# Patient Record
Sex: Male | Born: 1935 | Race: White | Hispanic: No | Smoking: Never smoker
Health system: Southern US, Community
[De-identification: ages and names within clinical notes are randomized; demographics above are authoritative.]

## PROBLEM LIST (undated history)

## (undated) DIAGNOSIS — I639 Cerebral infarction, unspecified: Secondary | ICD-10-CM

## (undated) DIAGNOSIS — I10 Essential (primary) hypertension: Secondary | ICD-10-CM

## (undated) DIAGNOSIS — E7800 Pure hypercholesterolemia, unspecified: Secondary | ICD-10-CM

## (undated) DIAGNOSIS — M1990 Unspecified osteoarthritis, unspecified site: Secondary | ICD-10-CM

## (undated) DIAGNOSIS — T304 Corrosion of unspecified body region, unspecified degree: Secondary | ICD-10-CM

## (undated) DIAGNOSIS — I4891 Unspecified atrial fibrillation: Secondary | ICD-10-CM

## (undated) DIAGNOSIS — N289 Disorder of kidney and ureter, unspecified: Secondary | ICD-10-CM

## (undated) DIAGNOSIS — C4490 Unspecified malignant neoplasm of skin, unspecified: Secondary | ICD-10-CM

## (undated) HISTORY — PX: KNEE SURGERY: SHX244

## (undated) HISTORY — PX: SHOULDER SURGERY: SHX246

## (undated) HISTORY — PX: TONSILLECTOMY: SUR1361

---

## 2001-11-06 ENCOUNTER — Emergency Department (HOSPITAL_COMMUNITY): Admission: EM | Admit: 2001-11-06 | Discharge: 2001-11-06 | Payer: Self-pay | Admitting: Emergency Medicine

## 2003-06-26 ENCOUNTER — Ambulatory Visit (HOSPITAL_COMMUNITY): Admission: RE | Admit: 2003-06-26 | Discharge: 2003-06-26 | Payer: Self-pay | Admitting: Internal Medicine

## 2005-11-21 ENCOUNTER — Ambulatory Visit (HOSPITAL_COMMUNITY): Admission: RE | Admit: 2005-11-21 | Discharge: 2005-11-21 | Payer: Self-pay | Admitting: Internal Medicine

## 2008-01-21 ENCOUNTER — Ambulatory Visit: Payer: Self-pay | Admitting: Gastroenterology

## 2008-01-28 ENCOUNTER — Encounter: Payer: Self-pay | Admitting: Gastroenterology

## 2008-01-28 ENCOUNTER — Ambulatory Visit (HOSPITAL_COMMUNITY): Admission: RE | Admit: 2008-01-28 | Discharge: 2008-01-28 | Payer: Self-pay | Admitting: Gastroenterology

## 2008-01-28 ENCOUNTER — Ambulatory Visit: Payer: Self-pay | Admitting: Gastroenterology

## 2010-07-20 NOTE — Op Note (Signed)
NAME:  Joseph Stephenson, Joseph Stephenson NO.:  [id number]   MEDICAL RECORD NO.:  [id number]          PATIENT TYPE:  AMB   LOCATION:  DAY                           FACILITY:  [facility]   PHYSICIAN:  Kassie Mends, M.D.      DATE OF BIRTH:  12-Sep-1925   DATE OF PROCEDURE:  01/28/2008  DATE OF DISCHARGE:                               OPERATIVE REPORT   REFERRING PHYSICIAN:  Kingsley Callander. Ouida Sills, MD   PROCEDURE:  Colonoscopy with cold forceps and snare cautery polypectomy.   INDICATION FOR EXAM:  Joseph Stephenson is an 77 year old male who had  colonoscopy in 1997 with hyperplastic polyps.   FINDINGS:  1. A 4-mm sessile transverse colon polyp removed via cold forceps.  A      6-sessile rectal polyp removed via cold forceps.  A 6-sessile      transverse colon polyps removed via snare cautery and cold forceps.      The polyps vary from 3 mm to 6 mm.  2. Rare sigmoid colon and diverticula.  Otherwise, no masses,      inflammatory changes, or AVMs seen.  3. Normal retroflexed view of the rectum.   DIAGNOSES:  1. Mild diverticulosis.  2. Colon polyps.   RECOMMENDATIONS:  1. No aspirin, NSAIDs, or anticoagulation for 5 days.  2. Will Joseph Stephenson with results of his biopsies.  If he has simple      adenomas or hyperplastic polyps, then he may have a repeat      colonoscopy in 10-15 years if he remains healthy.  3. He should follow a high-fiber diet.  Given handout on high-fiber      diet and polyps.   MEDICATIONS:  1. Demerol 50 mg IV.  2. Versed 3 mg IV.   PROCEDURE TECHNIQUE:  Physical exam was performed.  Informed consent was  obtained from the patient explaining the benefits, risks, and  alternatives to the procedure.  The patient was connected to monitor and  placed in left lateral position.  Continuous oxygen was provided by  nasal cannula.  IV medicine administered through an indwelling cannula.  After administration of sedation and rectal exam, the patient's rectum  was intubated and the  scope was advanced under direct visualization to  the cecum. The scope was removed slowly by carefully examining  the color, texture, anatomy, and integrity of the mucosa on the way out.  The patient was recovered in endoscopy and discharged home in  satisfactory condition.   PATH:  Simple adenoma & hyperplastic polyps.      Kassie Mends, M.D.  Electronically Signed     SM/MEDQ  D:  01/28/2008  T:  01/29/2008  Job:  [id number]   cc:   Kingsley Callander. Ouida Sills, MD  Fax: [phone]

## 2010-07-20 NOTE — Consult Note (Signed)
NAME:  Joseph Stephenson, Joseph Stephenson NO.:  [id number]   MEDICAL RECORD NO.:  [id number]          PATIENT TYPE:  AMB   LOCATION:  DAY                           FACILITY:  [facility]   PHYSICIAN:  Joseph Stephenson, M.D.      DATE OF BIRTH:  10-21-1925   DATE OF CONSULTATION:  01/21/2008  DATE OF DISCHARGE:                                 CONSULTATION   REASON FOR CONSULTATION:  History of colon polyps, time for colonoscopy.   PHYSICIAN REQUESTING CONSULTATION:  Joseph Stephenson. Joseph Sills, MD   HISTORY OF PRESENT ILLNESS:  The patient is an 77 year old Caucasian  gentleman, patient of Dr. Ouida Stephenson, who presents today to schedule  surveillance colonoscopy.  He has a history of polyps.  He had a  colonoscopy in 1997 and had multiple tiny polyps removed, but these were  hyperplastic.  He denies any ongoing problems.  His bowel movements were  regular.  He denies any blood in the stool or black stools.  He denies  any abdominal pain, nausea, vomiting, heartburn, dysphagia, or  odynophagia.  He has had about 28-pound intentional weight loss.   CURRENT MEDICATIONS:  1. Aspirin 81 mg daily.  2. Labetalol 100 mg b.i.d.  3. Amlodipine 5 mg daily.  4. Lipitor 10 mg daily.   ALLERGIES:  No known drug allergies.   PAST MEDICAL HISTORY:  1. Hypertension.  2. Hypercholesterolemia.  3. He had knee surgery for repair of cartilage.  4. He had shoulder surgery while in college.  5. Cataract extraction.  6. History of stroke in 1996.   FAMILY HISTORY:  Negative for colorectal cancer.  His mother died at age  80, childbirth injury.  Father died in his late 68s.   SOCIAL HISTORY:  He is married.  He has 2 grandchildren.  He is working  at [employer], where he has been working for 25 years.  He denies any tobacco use ever.  No alcohol use.   REVIEW OF SYSTEMS:  GI:  See HPI.  CONSTITUTIONAL:  See HPI.  CARDIOPULMONARY:  No chest pain, shortness of breath, palpitations, or  cough.  GENITOURINARY:   No dysuria or hematuria.   PHYSICAL EXAMINATION:  VITAL SIGNS:  Weight 200, height 5 feet 10  inches, temp 98, blood pressure 140/80, and pulse 60.  GENERAL:  Pleasant, mildly obese Caucasian gentleman in no acute  distress.  SKIN:  Warm and dry.  No jaundice.  HEENT:  Sclerae nonicteric.  Oropharyngeal mucosa moist and pink.  No  lesions, erythema, or exudate.  No lymphadenopathy or thyromegaly.  CHEST:  Lungs were clear to auscultation.  CARDIAC:  Regular rate and rhythm.  Normal S1 and S2.  No murmurs, rubs,  or gallops.  ABDOMEN:  Positive bowel sounds.  Abdomen soft, nontender, and  nondistended.  No organomegaly or masses.  No rebound or guarding.  No  abdominal bruits or hernias.  LOWER EXTREMITIES:  No edema.   IMPRESSION:  Joseph Stephenson is an 49 year old gentleman with history of  hyperplastic polyps on colonoscopy back in 1997.  He had prior  colonoscopy in 1992  with polyps with histology unavailable.   PLAN:  1. Colonoscopy in the near future with Dr. Cira Stephenson.  2. We will discuss with Dr. Cira Stephenson regarding his aspirin use prior to      colonoscopy.      Joseph Stephenson, P.A.      Joseph Stephenson, M.D.  Electronically Signed    LL/MEDQ  D:  01/21/2008  T:  01/22/2008  Job:  [id number]   cc:   Joseph Stephenson. Joseph Sills, MD  Fax: [phone]

## 2014-07-24 ENCOUNTER — Telehealth: Payer: Self-pay | Admitting: Gastroenterology

## 2014-07-24 NOTE — Telephone Encounter (Signed)
REFERRED BY DR. Willey Blade. NEEDS OPV ASAP FOR Bright red blood in stool yesterday am.

## 2014-07-24 NOTE — Telephone Encounter (Signed)
MADE APPOINTMENT FOR 07/25/14 AT 9AM AND LEFT MESSAGE ON PATIENT HOME PHONE

## 2014-07-25 ENCOUNTER — Ambulatory Visit: Payer: Self-pay | Admitting: Gastroenterology

## 2014-07-25 NOTE — Telephone Encounter (Signed)
Patient called and stated he didn't think he needed this appointment today and he will call Dr. Ria Comment office to make sure and give our office a call back.

## 2015-07-14 DIAGNOSIS — N189 Chronic kidney disease, unspecified: Secondary | ICD-10-CM | POA: Diagnosis not present

## 2015-07-14 DIAGNOSIS — I6350 Cerebral infarction due to unspecified occlusion or stenosis of unspecified cerebral artery: Secondary | ICD-10-CM | POA: Diagnosis not present

## 2015-07-14 DIAGNOSIS — Z79899 Other long term (current) drug therapy: Secondary | ICD-10-CM | POA: Diagnosis not present

## 2015-07-21 DIAGNOSIS — I10 Essential (primary) hypertension: Secondary | ICD-10-CM | POA: Diagnosis not present

## 2015-07-21 DIAGNOSIS — N183 Chronic kidney disease, stage 3 (moderate): Secondary | ICD-10-CM | POA: Diagnosis not present

## 2015-07-21 DIAGNOSIS — E875 Hyperkalemia: Secondary | ICD-10-CM | POA: Diagnosis not present

## 2015-08-14 DIAGNOSIS — H401131 Primary open-angle glaucoma, bilateral, mild stage: Secondary | ICD-10-CM | POA: Diagnosis not present

## 2015-10-13 DIAGNOSIS — N189 Chronic kidney disease, unspecified: Secondary | ICD-10-CM | POA: Diagnosis not present

## 2015-10-13 DIAGNOSIS — E785 Hyperlipidemia, unspecified: Secondary | ICD-10-CM | POA: Diagnosis not present

## 2015-10-13 DIAGNOSIS — I10 Essential (primary) hypertension: Secondary | ICD-10-CM | POA: Diagnosis not present

## 2015-10-13 DIAGNOSIS — Z79899 Other long term (current) drug therapy: Secondary | ICD-10-CM | POA: Diagnosis not present

## 2015-10-20 DIAGNOSIS — I10 Essential (primary) hypertension: Secondary | ICD-10-CM | POA: Diagnosis not present

## 2015-10-20 DIAGNOSIS — E875 Hyperkalemia: Secondary | ICD-10-CM | POA: Diagnosis not present

## 2015-10-20 DIAGNOSIS — N183 Chronic kidney disease, stage 3 (moderate): Secondary | ICD-10-CM | POA: Diagnosis not present

## 2016-01-08 DIAGNOSIS — H32 Chorioretinal disorders in diseases classified elsewhere: Secondary | ICD-10-CM | POA: Diagnosis not present

## 2016-01-08 DIAGNOSIS — B399 Histoplasmosis, unspecified: Secondary | ICD-10-CM | POA: Diagnosis not present

## 2016-01-18 DIAGNOSIS — N189 Chronic kidney disease, unspecified: Secondary | ICD-10-CM | POA: Diagnosis not present

## 2016-01-18 DIAGNOSIS — I6350 Cerebral infarction due to unspecified occlusion or stenosis of unspecified cerebral artery: Secondary | ICD-10-CM | POA: Diagnosis not present

## 2016-01-18 DIAGNOSIS — E875 Hyperkalemia: Secondary | ICD-10-CM | POA: Diagnosis not present

## 2016-01-18 DIAGNOSIS — E785 Hyperlipidemia, unspecified: Secondary | ICD-10-CM | POA: Diagnosis not present

## 2016-01-18 DIAGNOSIS — I10 Essential (primary) hypertension: Secondary | ICD-10-CM | POA: Diagnosis not present

## 2016-01-18 DIAGNOSIS — Z79899 Other long term (current) drug therapy: Secondary | ICD-10-CM | POA: Diagnosis not present

## 2016-01-25 DIAGNOSIS — I10 Essential (primary) hypertension: Secondary | ICD-10-CM | POA: Diagnosis not present

## 2016-01-25 DIAGNOSIS — Z23 Encounter for immunization: Secondary | ICD-10-CM | POA: Diagnosis not present

## 2016-01-25 DIAGNOSIS — E785 Hyperlipidemia, unspecified: Secondary | ICD-10-CM | POA: Diagnosis not present

## 2016-01-25 DIAGNOSIS — N183 Chronic kidney disease, stage 3 (moderate): Secondary | ICD-10-CM | POA: Diagnosis not present

## 2016-01-25 DIAGNOSIS — E875 Hyperkalemia: Secondary | ICD-10-CM | POA: Diagnosis not present

## 2016-02-17 DIAGNOSIS — H401131 Primary open-angle glaucoma, bilateral, mild stage: Secondary | ICD-10-CM | POA: Diagnosis not present

## 2016-04-19 DIAGNOSIS — E785 Hyperlipidemia, unspecified: Secondary | ICD-10-CM | POA: Diagnosis not present

## 2016-04-19 DIAGNOSIS — E875 Hyperkalemia: Secondary | ICD-10-CM | POA: Diagnosis not present

## 2016-04-19 DIAGNOSIS — Z79899 Other long term (current) drug therapy: Secondary | ICD-10-CM | POA: Diagnosis not present

## 2016-04-19 DIAGNOSIS — I10 Essential (primary) hypertension: Secondary | ICD-10-CM | POA: Diagnosis not present

## 2016-04-26 DIAGNOSIS — N183 Chronic kidney disease, stage 3 (moderate): Secondary | ICD-10-CM | POA: Diagnosis not present

## 2016-04-26 DIAGNOSIS — I10 Essential (primary) hypertension: Secondary | ICD-10-CM | POA: Diagnosis not present

## 2016-04-26 DIAGNOSIS — Z6833 Body mass index (BMI) 33.0-33.9, adult: Secondary | ICD-10-CM | POA: Diagnosis not present

## 2016-04-26 DIAGNOSIS — E875 Hyperkalemia: Secondary | ICD-10-CM | POA: Diagnosis not present

## 2016-06-13 ENCOUNTER — Ambulatory Visit (INDEPENDENT_AMBULATORY_CARE_PROVIDER_SITE_OTHER): Payer: Medicare Other | Admitting: Otolaryngology

## 2016-06-13 DIAGNOSIS — H6123 Impacted cerumen, bilateral: Secondary | ICD-10-CM

## 2016-06-13 DIAGNOSIS — H900 Conductive hearing loss, bilateral: Secondary | ICD-10-CM

## 2016-07-19 DIAGNOSIS — N183 Chronic kidney disease, stage 3 (moderate): Secondary | ICD-10-CM | POA: Diagnosis not present

## 2016-07-19 DIAGNOSIS — E875 Hyperkalemia: Secondary | ICD-10-CM | POA: Diagnosis not present

## 2016-07-19 DIAGNOSIS — I10 Essential (primary) hypertension: Secondary | ICD-10-CM | POA: Diagnosis not present

## 2016-07-19 DIAGNOSIS — Z79899 Other long term (current) drug therapy: Secondary | ICD-10-CM | POA: Diagnosis not present

## 2016-07-26 DIAGNOSIS — Z23 Encounter for immunization: Secondary | ICD-10-CM | POA: Diagnosis not present

## 2016-07-26 DIAGNOSIS — I10 Essential (primary) hypertension: Secondary | ICD-10-CM | POA: Diagnosis not present

## 2016-07-26 DIAGNOSIS — E875 Hyperkalemia: Secondary | ICD-10-CM | POA: Diagnosis not present

## 2016-07-26 DIAGNOSIS — N183 Chronic kidney disease, stage 3 (moderate): Secondary | ICD-10-CM | POA: Diagnosis not present

## 2016-08-18 DIAGNOSIS — Z961 Presence of intraocular lens: Secondary | ICD-10-CM | POA: Diagnosis not present

## 2016-08-18 DIAGNOSIS — H401131 Primary open-angle glaucoma, bilateral, mild stage: Secondary | ICD-10-CM | POA: Diagnosis not present

## 2016-08-18 DIAGNOSIS — Z9841 Cataract extraction status, right eye: Secondary | ICD-10-CM | POA: Diagnosis not present

## 2016-08-18 DIAGNOSIS — H43391 Other vitreous opacities, right eye: Secondary | ICD-10-CM | POA: Diagnosis not present

## 2016-10-10 ENCOUNTER — Ambulatory Visit (INDEPENDENT_AMBULATORY_CARE_PROVIDER_SITE_OTHER): Payer: Medicare Other | Admitting: Otolaryngology

## 2016-10-10 DIAGNOSIS — H6123 Impacted cerumen, bilateral: Secondary | ICD-10-CM

## 2016-10-27 DIAGNOSIS — E785 Hyperlipidemia, unspecified: Secondary | ICD-10-CM | POA: Diagnosis not present

## 2016-10-27 DIAGNOSIS — Z79899 Other long term (current) drug therapy: Secondary | ICD-10-CM | POA: Diagnosis not present

## 2016-10-27 DIAGNOSIS — E875 Hyperkalemia: Secondary | ICD-10-CM | POA: Diagnosis not present

## 2016-11-08 DIAGNOSIS — N183 Chronic kidney disease, stage 3 (moderate): Secondary | ICD-10-CM | POA: Diagnosis not present

## 2016-11-08 DIAGNOSIS — I10 Essential (primary) hypertension: Secondary | ICD-10-CM | POA: Diagnosis not present

## 2016-11-08 DIAGNOSIS — E875 Hyperkalemia: Secondary | ICD-10-CM | POA: Diagnosis not present

## 2016-12-23 DIAGNOSIS — B399 Histoplasmosis, unspecified: Secondary | ICD-10-CM | POA: Diagnosis not present

## 2016-12-23 DIAGNOSIS — H32 Chorioretinal disorders in diseases classified elsewhere: Secondary | ICD-10-CM | POA: Diagnosis not present

## 2017-01-30 DIAGNOSIS — E875 Hyperkalemia: Secondary | ICD-10-CM | POA: Diagnosis not present

## 2017-01-30 DIAGNOSIS — G464 Cerebellar stroke syndrome: Secondary | ICD-10-CM | POA: Diagnosis not present

## 2017-01-30 DIAGNOSIS — N183 Chronic kidney disease, stage 3 (moderate): Secondary | ICD-10-CM | POA: Diagnosis not present

## 2017-01-30 DIAGNOSIS — Z79899 Other long term (current) drug therapy: Secondary | ICD-10-CM | POA: Diagnosis not present

## 2017-02-06 DIAGNOSIS — E875 Hyperkalemia: Secondary | ICD-10-CM | POA: Diagnosis not present

## 2017-02-06 DIAGNOSIS — N183 Chronic kidney disease, stage 3 (moderate): Secondary | ICD-10-CM | POA: Diagnosis not present

## 2017-02-06 DIAGNOSIS — I10 Essential (primary) hypertension: Secondary | ICD-10-CM | POA: Diagnosis not present

## 2017-02-06 DIAGNOSIS — E785 Hyperlipidemia, unspecified: Secondary | ICD-10-CM | POA: Diagnosis not present

## 2017-03-03 DIAGNOSIS — H401131 Primary open-angle glaucoma, bilateral, mild stage: Secondary | ICD-10-CM | POA: Diagnosis not present

## 2017-05-15 DIAGNOSIS — I10 Essential (primary) hypertension: Secondary | ICD-10-CM | POA: Diagnosis not present

## 2017-05-15 DIAGNOSIS — E785 Hyperlipidemia, unspecified: Secondary | ICD-10-CM | POA: Diagnosis not present

## 2017-05-15 DIAGNOSIS — Z79899 Other long term (current) drug therapy: Secondary | ICD-10-CM | POA: Diagnosis not present

## 2017-05-15 DIAGNOSIS — E875 Hyperkalemia: Secondary | ICD-10-CM | POA: Diagnosis not present

## 2017-05-22 DIAGNOSIS — N183 Chronic kidney disease, stage 3 (moderate): Secondary | ICD-10-CM | POA: Diagnosis not present

## 2017-05-22 DIAGNOSIS — C4491 Basal cell carcinoma of skin, unspecified: Secondary | ICD-10-CM | POA: Diagnosis not present

## 2017-05-22 DIAGNOSIS — Z6833 Body mass index (BMI) 33.0-33.9, adult: Secondary | ICD-10-CM | POA: Diagnosis not present

## 2017-05-22 DIAGNOSIS — E875 Hyperkalemia: Secondary | ICD-10-CM | POA: Diagnosis not present

## 2017-05-31 DIAGNOSIS — C44319 Basal cell carcinoma of skin of other parts of face: Secondary | ICD-10-CM | POA: Diagnosis not present

## 2017-07-10 DIAGNOSIS — I10 Essential (primary) hypertension: Secondary | ICD-10-CM | POA: Diagnosis not present

## 2017-07-10 DIAGNOSIS — E875 Hyperkalemia: Secondary | ICD-10-CM | POA: Diagnosis not present

## 2017-07-10 DIAGNOSIS — Z79899 Other long term (current) drug therapy: Secondary | ICD-10-CM | POA: Diagnosis not present

## 2017-07-10 DIAGNOSIS — N183 Chronic kidney disease, stage 3 (moderate): Secondary | ICD-10-CM | POA: Diagnosis not present

## 2017-07-12 DIAGNOSIS — C44319 Basal cell carcinoma of skin of other parts of face: Secondary | ICD-10-CM | POA: Diagnosis not present

## 2017-07-17 DIAGNOSIS — Z6833 Body mass index (BMI) 33.0-33.9, adult: Secondary | ICD-10-CM | POA: Diagnosis not present

## 2017-07-17 DIAGNOSIS — I10 Essential (primary) hypertension: Secondary | ICD-10-CM | POA: Diagnosis not present

## 2017-07-17 DIAGNOSIS — Z8673 Personal history of transient ischemic attack (TIA), and cerebral infarction without residual deficits: Secondary | ICD-10-CM | POA: Diagnosis not present

## 2017-07-17 DIAGNOSIS — E875 Hyperkalemia: Secondary | ICD-10-CM | POA: Diagnosis not present

## 2017-08-07 DIAGNOSIS — Z961 Presence of intraocular lens: Secondary | ICD-10-CM | POA: Diagnosis not present

## 2017-08-07 DIAGNOSIS — H26492 Other secondary cataract, left eye: Secondary | ICD-10-CM | POA: Diagnosis not present

## 2017-08-07 DIAGNOSIS — H353134 Nonexudative age-related macular degeneration, bilateral, advanced atrophic with subfoveal involvement: Secondary | ICD-10-CM | POA: Diagnosis not present

## 2017-08-23 DIAGNOSIS — C44319 Basal cell carcinoma of skin of other parts of face: Secondary | ICD-10-CM | POA: Diagnosis not present

## 2017-08-31 DIAGNOSIS — C44319 Basal cell carcinoma of skin of other parts of face: Secondary | ICD-10-CM | POA: Diagnosis not present

## 2017-09-01 DIAGNOSIS — H401131 Primary open-angle glaucoma, bilateral, mild stage: Secondary | ICD-10-CM | POA: Diagnosis not present

## 2017-09-01 DIAGNOSIS — H04123 Dry eye syndrome of bilateral lacrimal glands: Secondary | ICD-10-CM | POA: Diagnosis not present

## 2017-09-01 DIAGNOSIS — Z9849 Cataract extraction status, unspecified eye: Secondary | ICD-10-CM | POA: Diagnosis not present

## 2017-09-01 DIAGNOSIS — Z961 Presence of intraocular lens: Secondary | ICD-10-CM | POA: Diagnosis not present

## 2017-10-10 DIAGNOSIS — L570 Actinic keratosis: Secondary | ICD-10-CM | POA: Diagnosis not present

## 2017-10-10 DIAGNOSIS — X32XXXD Exposure to sunlight, subsequent encounter: Secondary | ICD-10-CM | POA: Diagnosis not present

## 2017-10-10 DIAGNOSIS — Z08 Encounter for follow-up examination after completed treatment for malignant neoplasm: Secondary | ICD-10-CM | POA: Diagnosis not present

## 2017-10-10 DIAGNOSIS — Z85828 Personal history of other malignant neoplasm of skin: Secondary | ICD-10-CM | POA: Diagnosis not present

## 2017-10-16 DIAGNOSIS — I10 Essential (primary) hypertension: Secondary | ICD-10-CM | POA: Diagnosis not present

## 2017-10-16 DIAGNOSIS — Z79899 Other long term (current) drug therapy: Secondary | ICD-10-CM | POA: Diagnosis not present

## 2017-10-16 DIAGNOSIS — I6350 Cerebral infarction due to unspecified occlusion or stenosis of unspecified cerebral artery: Secondary | ICD-10-CM | POA: Diagnosis not present

## 2017-10-16 DIAGNOSIS — E785 Hyperlipidemia, unspecified: Secondary | ICD-10-CM | POA: Diagnosis not present

## 2017-10-23 DIAGNOSIS — I10 Essential (primary) hypertension: Secondary | ICD-10-CM | POA: Diagnosis not present

## 2017-10-23 DIAGNOSIS — E875 Hyperkalemia: Secondary | ICD-10-CM | POA: Diagnosis not present

## 2017-10-23 DIAGNOSIS — N183 Chronic kidney disease, stage 3 (moderate): Secondary | ICD-10-CM | POA: Diagnosis not present

## 2017-12-14 ENCOUNTER — Encounter: Payer: Self-pay | Admitting: Gastroenterology

## 2017-12-28 DIAGNOSIS — H353134 Nonexudative age-related macular degeneration, bilateral, advanced atrophic with subfoveal involvement: Secondary | ICD-10-CM | POA: Diagnosis not present

## 2018-01-16 DIAGNOSIS — G464 Cerebellar stroke syndrome: Secondary | ICD-10-CM | POA: Diagnosis not present

## 2018-01-16 DIAGNOSIS — N183 Chronic kidney disease, stage 3 (moderate): Secondary | ICD-10-CM | POA: Diagnosis not present

## 2018-01-16 DIAGNOSIS — E875 Hyperkalemia: Secondary | ICD-10-CM | POA: Diagnosis not present

## 2018-01-16 DIAGNOSIS — I10 Essential (primary) hypertension: Secondary | ICD-10-CM | POA: Diagnosis not present

## 2018-01-22 DIAGNOSIS — N183 Chronic kidney disease, stage 3 (moderate): Secondary | ICD-10-CM | POA: Diagnosis not present

## 2018-01-22 DIAGNOSIS — Z23 Encounter for immunization: Secondary | ICD-10-CM | POA: Diagnosis not present

## 2018-01-22 DIAGNOSIS — E875 Hyperkalemia: Secondary | ICD-10-CM | POA: Diagnosis not present

## 2018-02-16 DIAGNOSIS — N183 Chronic kidney disease, stage 3 (moderate): Secondary | ICD-10-CM | POA: Diagnosis not present

## 2018-02-16 DIAGNOSIS — E875 Hyperkalemia: Secondary | ICD-10-CM | POA: Diagnosis not present

## 2018-02-22 DIAGNOSIS — N183 Chronic kidney disease, stage 3 (moderate): Secondary | ICD-10-CM | POA: Diagnosis not present

## 2018-02-22 DIAGNOSIS — E875 Hyperkalemia: Secondary | ICD-10-CM | POA: Diagnosis not present

## 2018-03-16 DIAGNOSIS — Z9849 Cataract extraction status, unspecified eye: Secondary | ICD-10-CM | POA: Diagnosis not present

## 2018-03-16 DIAGNOSIS — H43393 Other vitreous opacities, bilateral: Secondary | ICD-10-CM | POA: Diagnosis not present

## 2018-03-16 DIAGNOSIS — Z961 Presence of intraocular lens: Secondary | ICD-10-CM | POA: Diagnosis not present

## 2018-03-16 DIAGNOSIS — H04123 Dry eye syndrome of bilateral lacrimal glands: Secondary | ICD-10-CM | POA: Diagnosis not present

## 2018-04-26 DIAGNOSIS — N183 Chronic kidney disease, stage 3 (moderate): Secondary | ICD-10-CM | POA: Diagnosis not present

## 2018-04-26 DIAGNOSIS — E875 Hyperkalemia: Secondary | ICD-10-CM | POA: Diagnosis not present

## 2018-05-04 ENCOUNTER — Ambulatory Visit (HOSPITAL_COMMUNITY)
Admission: RE | Admit: 2018-05-04 | Discharge: 2018-05-04 | Disposition: A | Discharge status: Home / Self Care | Payer: Medicare Other | Source: Ambulatory Visit | Attending: Internal Medicine | Admitting: Internal Medicine

## 2018-05-04 ENCOUNTER — Other Ambulatory Visit (HOSPITAL_COMMUNITY): Payer: Self-pay | Admitting: Internal Medicine

## 2018-05-04 DIAGNOSIS — M25552 Pain in left hip: Secondary | ICD-10-CM

## 2018-05-04 DIAGNOSIS — I129 Hypertensive chronic kidney disease with stage 1 through stage 4 chronic kidney disease, or unspecified chronic kidney disease: Secondary | ICD-10-CM | POA: Diagnosis not present

## 2018-05-04 DIAGNOSIS — E875 Hyperkalemia: Secondary | ICD-10-CM | POA: Diagnosis not present

## 2018-05-04 DIAGNOSIS — N183 Chronic kidney disease, stage 3 (moderate): Secondary | ICD-10-CM | POA: Diagnosis not present

## 2018-05-04 DIAGNOSIS — M1612 Unilateral primary osteoarthritis, left hip: Secondary | ICD-10-CM | POA: Diagnosis not present

## 2018-05-04 DIAGNOSIS — Z6832 Body mass index (BMI) 32.0-32.9, adult: Secondary | ICD-10-CM | POA: Diagnosis not present

## 2018-07-26 DIAGNOSIS — E875 Hyperkalemia: Secondary | ICD-10-CM | POA: Diagnosis not present

## 2018-07-26 DIAGNOSIS — N183 Chronic kidney disease, stage 3 (moderate): Secondary | ICD-10-CM | POA: Diagnosis not present

## 2018-07-26 DIAGNOSIS — Z79899 Other long term (current) drug therapy: Secondary | ICD-10-CM | POA: Diagnosis not present

## 2018-07-26 DIAGNOSIS — I10 Essential (primary) hypertension: Secondary | ICD-10-CM | POA: Diagnosis not present

## 2018-08-02 DIAGNOSIS — N183 Chronic kidney disease, stage 3 (moderate): Secondary | ICD-10-CM | POA: Diagnosis not present

## 2018-08-02 DIAGNOSIS — E875 Hyperkalemia: Secondary | ICD-10-CM | POA: Diagnosis not present

## 2018-11-05 DIAGNOSIS — E875 Hyperkalemia: Secondary | ICD-10-CM | POA: Diagnosis not present

## 2018-11-05 DIAGNOSIS — N183 Chronic kidney disease, stage 3 (moderate): Secondary | ICD-10-CM | POA: Diagnosis not present

## 2018-11-09 DIAGNOSIS — E875 Hyperkalemia: Secondary | ICD-10-CM | POA: Diagnosis not present

## 2018-11-09 DIAGNOSIS — N183 Chronic kidney disease, stage 3 (moderate): Secondary | ICD-10-CM | POA: Diagnosis not present

## 2018-11-09 DIAGNOSIS — Z79899 Other long term (current) drug therapy: Secondary | ICD-10-CM | POA: Diagnosis not present

## 2019-02-07 DIAGNOSIS — N183 Chronic kidney disease, stage 3 unspecified: Secondary | ICD-10-CM | POA: Diagnosis not present

## 2019-02-07 DIAGNOSIS — E875 Hyperkalemia: Secondary | ICD-10-CM | POA: Diagnosis not present

## 2019-02-07 DIAGNOSIS — Z79899 Other long term (current) drug therapy: Secondary | ICD-10-CM | POA: Diagnosis not present

## 2019-02-07 DIAGNOSIS — Z23 Encounter for immunization: Secondary | ICD-10-CM | POA: Diagnosis not present

## 2019-02-07 DIAGNOSIS — I10 Essential (primary) hypertension: Secondary | ICD-10-CM | POA: Diagnosis not present

## 2019-02-07 DIAGNOSIS — N1832 Chronic kidney disease, stage 3b: Secondary | ICD-10-CM | POA: Diagnosis not present

## 2019-02-07 DIAGNOSIS — Z8673 Personal history of transient ischemic attack (TIA), and cerebral infarction without residual deficits: Secondary | ICD-10-CM | POA: Diagnosis not present

## 2019-05-17 DIAGNOSIS — E875 Hyperkalemia: Secondary | ICD-10-CM | POA: Diagnosis not present

## 2019-05-17 DIAGNOSIS — N183 Chronic kidney disease, stage 3 unspecified: Secondary | ICD-10-CM | POA: Diagnosis not present

## 2019-05-23 DIAGNOSIS — N1831 Chronic kidney disease, stage 3a: Secondary | ICD-10-CM | POA: Diagnosis not present

## 2019-05-23 DIAGNOSIS — E875 Hyperkalemia: Secondary | ICD-10-CM | POA: Diagnosis not present

## 2019-07-11 DIAGNOSIS — Z6832 Body mass index (BMI) 32.0-32.9, adult: Secondary | ICD-10-CM | POA: Diagnosis not present

## 2019-07-11 DIAGNOSIS — M25462 Effusion, left knee: Secondary | ICD-10-CM | POA: Diagnosis not present

## 2019-08-27 DIAGNOSIS — Z79899 Other long term (current) drug therapy: Secondary | ICD-10-CM | POA: Diagnosis not present

## 2019-08-27 DIAGNOSIS — E875 Hyperkalemia: Secondary | ICD-10-CM | POA: Diagnosis not present

## 2019-08-27 DIAGNOSIS — N183 Chronic kidney disease, stage 3 unspecified: Secondary | ICD-10-CM | POA: Diagnosis not present

## 2019-09-03 DIAGNOSIS — E875 Hyperkalemia: Secondary | ICD-10-CM | POA: Diagnosis not present

## 2019-09-03 DIAGNOSIS — Z6830 Body mass index (BMI) 30.0-30.9, adult: Secondary | ICD-10-CM | POA: Diagnosis not present

## 2019-09-03 DIAGNOSIS — I10 Essential (primary) hypertension: Secondary | ICD-10-CM | POA: Diagnosis not present

## 2019-09-03 DIAGNOSIS — N183 Chronic kidney disease, stage 3 unspecified: Secondary | ICD-10-CM | POA: Diagnosis not present

## 2019-12-13 DIAGNOSIS — G464 Cerebellar stroke syndrome: Secondary | ICD-10-CM | POA: Diagnosis not present

## 2019-12-13 DIAGNOSIS — N183 Chronic kidney disease, stage 3 unspecified: Secondary | ICD-10-CM | POA: Diagnosis not present

## 2019-12-13 DIAGNOSIS — I10 Essential (primary) hypertension: Secondary | ICD-10-CM | POA: Diagnosis not present

## 2019-12-13 DIAGNOSIS — E875 Hyperkalemia: Secondary | ICD-10-CM | POA: Diagnosis not present

## 2019-12-26 DIAGNOSIS — Z23 Encounter for immunization: Secondary | ICD-10-CM | POA: Diagnosis not present

## 2019-12-26 DIAGNOSIS — I10 Essential (primary) hypertension: Secondary | ICD-10-CM | POA: Diagnosis not present

## 2019-12-26 DIAGNOSIS — E875 Hyperkalemia: Secondary | ICD-10-CM | POA: Diagnosis not present

## 2019-12-26 DIAGNOSIS — N183 Chronic kidney disease, stage 3 unspecified: Secondary | ICD-10-CM | POA: Diagnosis not present

## 2020-03-20 DIAGNOSIS — E875 Hyperkalemia: Secondary | ICD-10-CM | POA: Diagnosis not present

## 2020-03-20 DIAGNOSIS — N183 Chronic kidney disease, stage 3 unspecified: Secondary | ICD-10-CM | POA: Diagnosis not present

## 2020-03-20 DIAGNOSIS — Z79899 Other long term (current) drug therapy: Secondary | ICD-10-CM | POA: Diagnosis not present

## 2020-03-20 DIAGNOSIS — I10 Essential (primary) hypertension: Secondary | ICD-10-CM | POA: Diagnosis not present

## 2020-03-27 ENCOUNTER — Telehealth: Payer: Self-pay

## 2020-03-27 DIAGNOSIS — Z01812 Encounter for preprocedural laboratory examination: Secondary | ICD-10-CM | POA: Diagnosis not present

## 2020-03-27 DIAGNOSIS — I482 Chronic atrial fibrillation, unspecified: Secondary | ICD-10-CM | POA: Diagnosis not present

## 2020-03-27 DIAGNOSIS — N1831 Chronic kidney disease, stage 3a: Secondary | ICD-10-CM | POA: Diagnosis not present

## 2020-03-27 DIAGNOSIS — Z79899 Other long term (current) drug therapy: Secondary | ICD-10-CM | POA: Diagnosis not present

## 2020-03-27 DIAGNOSIS — I4891 Unspecified atrial fibrillation: Secondary | ICD-10-CM | POA: Diagnosis not present

## 2020-03-27 DIAGNOSIS — E876 Hypokalemia: Secondary | ICD-10-CM | POA: Diagnosis not present

## 2020-03-27 NOTE — Telephone Encounter (Signed)
Tried to reach pt to schedule echo , prefers Thursday or Friday ,    Dr Willey Blade office ordered for AFIB and h/o stroke  Order faxed to scheduling department

## 2020-03-30 ENCOUNTER — Other Ambulatory Visit (HOSPITAL_COMMUNITY): Payer: Self-pay | Admitting: Internal Medicine

## 2020-03-30 DIAGNOSIS — Z8673 Personal history of transient ischemic attack (TIA), and cerebral infarction without residual deficits: Secondary | ICD-10-CM

## 2020-03-30 DIAGNOSIS — I4891 Unspecified atrial fibrillation: Secondary | ICD-10-CM

## 2020-03-30 NOTE — Telephone Encounter (Signed)
Tried to reach patient to schedule echo

## 2020-04-03 DIAGNOSIS — Z23 Encounter for immunization: Secondary | ICD-10-CM | POA: Diagnosis not present

## 2020-04-10 ENCOUNTER — Other Ambulatory Visit (HOSPITAL_COMMUNITY): Payer: Medicare Other

## 2020-04-14 DIAGNOSIS — E02 Subclinical iodine-deficiency hypothyroidism: Secondary | ICD-10-CM | POA: Diagnosis not present

## 2020-04-14 DIAGNOSIS — I482 Chronic atrial fibrillation, unspecified: Secondary | ICD-10-CM | POA: Diagnosis not present

## 2020-04-16 ENCOUNTER — Ambulatory Visit (HOSPITAL_COMMUNITY)
Admission: RE | Admit: 2020-04-16 | Discharge: 2020-04-16 | Disposition: A | Discharge status: Home / Self Care | Payer: Medicare Other | Source: Ambulatory Visit | Attending: Internal Medicine | Admitting: Internal Medicine

## 2020-04-16 ENCOUNTER — Other Ambulatory Visit: Payer: Self-pay

## 2020-04-16 DIAGNOSIS — I4891 Unspecified atrial fibrillation: Secondary | ICD-10-CM | POA: Diagnosis not present

## 2020-04-16 DIAGNOSIS — Z8673 Personal history of transient ischemic attack (TIA), and cerebral infarction without residual deficits: Secondary | ICD-10-CM

## 2020-04-16 LAB — ECHOCARDIOGRAM COMPLETE
AR max vel: 2.57 cm2
AV Area VTI: 2.63 cm2
AV Area mean vel: 2.54 cm2
AV Mean grad: 5.9 mmHg
AV Peak grad: 11.2 mmHg
Ao pk vel: 1.67 m/s
Area-P 1/2: 4.74 cm2
P 1/2 time: 755 msec
S' Lateral: 2.6 cm

## 2020-04-16 NOTE — Progress Notes (Signed)
*  PRELIMINARY RESULTS* Echocardiogram 2D Echocardiogram has been performed.  Joseph Stephenson 04/16/2020, 3:07 PM

## 2020-05-05 DIAGNOSIS — E039 Hypothyroidism, unspecified: Secondary | ICD-10-CM | POA: Diagnosis not present

## 2020-05-05 DIAGNOSIS — Z79899 Other long term (current) drug therapy: Secondary | ICD-10-CM | POA: Diagnosis not present

## 2020-05-05 DIAGNOSIS — I482 Chronic atrial fibrillation, unspecified: Secondary | ICD-10-CM | POA: Diagnosis not present

## 2020-05-12 DIAGNOSIS — I482 Chronic atrial fibrillation, unspecified: Secondary | ICD-10-CM | POA: Diagnosis not present

## 2020-07-20 DIAGNOSIS — I10 Essential (primary) hypertension: Secondary | ICD-10-CM | POA: Diagnosis not present

## 2020-07-20 DIAGNOSIS — I4891 Unspecified atrial fibrillation: Secondary | ICD-10-CM | POA: Diagnosis not present

## 2020-07-20 DIAGNOSIS — Z79899 Other long term (current) drug therapy: Secondary | ICD-10-CM | POA: Diagnosis not present

## 2020-07-23 DIAGNOSIS — I10 Essential (primary) hypertension: Secondary | ICD-10-CM | POA: Diagnosis not present

## 2020-07-23 DIAGNOSIS — I4821 Permanent atrial fibrillation: Secondary | ICD-10-CM | POA: Diagnosis not present

## 2020-07-23 DIAGNOSIS — N1832 Chronic kidney disease, stage 3b: Secondary | ICD-10-CM | POA: Diagnosis not present

## 2020-07-23 DIAGNOSIS — E875 Hyperkalemia: Secondary | ICD-10-CM | POA: Diagnosis not present

## 2020-08-10 DIAGNOSIS — Z08 Encounter for follow-up examination after completed treatment for malignant neoplasm: Secondary | ICD-10-CM | POA: Diagnosis not present

## 2020-08-10 DIAGNOSIS — C44612 Basal cell carcinoma of skin of right upper limb, including shoulder: Secondary | ICD-10-CM | POA: Diagnosis not present

## 2020-08-10 DIAGNOSIS — Z85828 Personal history of other malignant neoplasm of skin: Secondary | ICD-10-CM | POA: Diagnosis not present

## 2020-08-10 DIAGNOSIS — C44319 Basal cell carcinoma of skin of other parts of face: Secondary | ICD-10-CM | POA: Diagnosis not present

## 2020-09-15 DIAGNOSIS — D2261 Melanocytic nevi of right upper limb, including shoulder: Secondary | ICD-10-CM | POA: Diagnosis not present

## 2020-09-15 DIAGNOSIS — C44319 Basal cell carcinoma of skin of other parts of face: Secondary | ICD-10-CM | POA: Diagnosis not present

## 2020-09-15 DIAGNOSIS — D485 Neoplasm of uncertain behavior of skin: Secondary | ICD-10-CM | POA: Diagnosis not present

## 2020-09-15 DIAGNOSIS — L570 Actinic keratosis: Secondary | ICD-10-CM | POA: Diagnosis not present

## 2020-09-15 DIAGNOSIS — X32XXXD Exposure to sunlight, subsequent encounter: Secondary | ICD-10-CM | POA: Diagnosis not present

## 2020-09-15 DIAGNOSIS — L910 Hypertrophic scar: Secondary | ICD-10-CM | POA: Diagnosis not present

## 2020-10-16 DIAGNOSIS — I4821 Permanent atrial fibrillation: Secondary | ICD-10-CM | POA: Diagnosis not present

## 2020-10-16 DIAGNOSIS — N1832 Chronic kidney disease, stage 3b: Secondary | ICD-10-CM | POA: Diagnosis not present

## 2020-10-16 DIAGNOSIS — E875 Hyperkalemia: Secondary | ICD-10-CM | POA: Diagnosis not present

## 2020-10-16 DIAGNOSIS — I10 Essential (primary) hypertension: Secondary | ICD-10-CM | POA: Diagnosis not present

## 2020-10-20 DIAGNOSIS — N1832 Chronic kidney disease, stage 3b: Secondary | ICD-10-CM | POA: Diagnosis not present

## 2020-10-20 DIAGNOSIS — L928 Other granulomatous disorders of the skin and subcutaneous tissue: Secondary | ICD-10-CM | POA: Diagnosis not present

## 2020-10-20 DIAGNOSIS — I4811 Longstanding persistent atrial fibrillation: Secondary | ICD-10-CM | POA: Diagnosis not present

## 2020-10-20 DIAGNOSIS — X32XXXD Exposure to sunlight, subsequent encounter: Secondary | ICD-10-CM | POA: Diagnosis not present

## 2020-10-20 DIAGNOSIS — L570 Actinic keratosis: Secondary | ICD-10-CM | POA: Diagnosis not present

## 2020-10-20 DIAGNOSIS — L910 Hypertrophic scar: Secondary | ICD-10-CM | POA: Diagnosis not present

## 2020-10-20 DIAGNOSIS — E039 Hypothyroidism, unspecified: Secondary | ICD-10-CM | POA: Diagnosis not present

## 2020-10-20 DIAGNOSIS — D485 Neoplasm of uncertain behavior of skin: Secondary | ICD-10-CM | POA: Diagnosis not present

## 2020-10-20 DIAGNOSIS — E875 Hyperkalemia: Secondary | ICD-10-CM | POA: Diagnosis not present

## 2020-10-20 DIAGNOSIS — C44319 Basal cell carcinoma of skin of other parts of face: Secondary | ICD-10-CM | POA: Diagnosis not present

## 2020-11-30 DIAGNOSIS — L928 Other granulomatous disorders of the skin and subcutaneous tissue: Secondary | ICD-10-CM | POA: Diagnosis not present

## 2020-11-30 DIAGNOSIS — L910 Hypertrophic scar: Secondary | ICD-10-CM | POA: Diagnosis not present

## 2020-11-30 DIAGNOSIS — Z85828 Personal history of other malignant neoplasm of skin: Secondary | ICD-10-CM | POA: Diagnosis not present

## 2020-11-30 DIAGNOSIS — Z08 Encounter for follow-up examination after completed treatment for malignant neoplasm: Secondary | ICD-10-CM | POA: Diagnosis not present

## 2020-12-05 ENCOUNTER — Emergency Department (HOSPITAL_COMMUNITY)
Admission: EM | Admit: 2020-12-05 | Discharge: 2020-12-05 | Disposition: A | Discharge status: Home / Self Care | Payer: Medicare Other | Attending: Emergency Medicine | Admitting: Emergency Medicine

## 2020-12-05 ENCOUNTER — Encounter (HOSPITAL_COMMUNITY): Payer: Self-pay | Admitting: Emergency Medicine

## 2020-12-05 ENCOUNTER — Other Ambulatory Visit: Payer: Self-pay

## 2020-12-05 ENCOUNTER — Emergency Department (HOSPITAL_COMMUNITY): Payer: Medicare Other

## 2020-12-05 DIAGNOSIS — Z85828 Personal history of other malignant neoplasm of skin: Secondary | ICD-10-CM | POA: Insufficient documentation

## 2020-12-05 DIAGNOSIS — M25551 Pain in right hip: Secondary | ICD-10-CM | POA: Diagnosis not present

## 2020-12-05 DIAGNOSIS — M1611 Unilateral primary osteoarthritis, right hip: Secondary | ICD-10-CM | POA: Insufficient documentation

## 2020-12-05 DIAGNOSIS — I10 Essential (primary) hypertension: Secondary | ICD-10-CM | POA: Insufficient documentation

## 2020-12-05 DIAGNOSIS — I4891 Unspecified atrial fibrillation: Secondary | ICD-10-CM | POA: Insufficient documentation

## 2020-12-05 DIAGNOSIS — X58XXXA Exposure to other specified factors, initial encounter: Secondary | ICD-10-CM | POA: Diagnosis not present

## 2020-12-05 DIAGNOSIS — M1711 Unilateral primary osteoarthritis, right knee: Secondary | ICD-10-CM | POA: Diagnosis not present

## 2020-12-05 DIAGNOSIS — M25461 Effusion, right knee: Secondary | ICD-10-CM | POA: Diagnosis not present

## 2020-12-05 DIAGNOSIS — R52 Pain, unspecified: Secondary | ICD-10-CM

## 2020-12-05 DIAGNOSIS — S8391XA Sprain of unspecified site of right knee, initial encounter: Secondary | ICD-10-CM | POA: Diagnosis not present

## 2020-12-05 DIAGNOSIS — S99911A Unspecified injury of right ankle, initial encounter: Secondary | ICD-10-CM | POA: Diagnosis present

## 2020-12-05 DIAGNOSIS — Z7901 Long term (current) use of anticoagulants: Secondary | ICD-10-CM | POA: Insufficient documentation

## 2020-12-05 DIAGNOSIS — Z79899 Other long term (current) drug therapy: Secondary | ICD-10-CM | POA: Insufficient documentation

## 2020-12-05 HISTORY — DX: Unspecified osteoarthritis, unspecified site: M19.90

## 2020-12-05 HISTORY — DX: Unspecified malignant neoplasm of skin, unspecified: C44.90

## 2020-12-05 HISTORY — DX: Disorder of kidney and ureter, unspecified: N28.9

## 2020-12-05 HISTORY — DX: Unspecified atrial fibrillation: I48.91

## 2020-12-05 HISTORY — DX: Corrosion of unspecified body region, unspecified degree: T30.4

## 2020-12-05 HISTORY — DX: Cerebral infarction, unspecified: I63.9

## 2020-12-05 HISTORY — DX: Pure hypercholesterolemia, unspecified: E78.00

## 2020-12-05 HISTORY — DX: Essential (primary) hypertension: I10

## 2020-12-05 LAB — CBC WITH DIFFERENTIAL/PLATELET
Abs Immature Granulocytes: 0.03 10*3/uL (ref 0.00–0.07)
Basophils Absolute: 0 10*3/uL (ref 0.0–0.1)
Basophils Relative: 0 %
Eosinophils Absolute: 0.1 10*3/uL (ref 0.0–0.5)
Eosinophils Relative: 1 %
HCT: 35.3 % — ABNORMAL LOW (ref 39.0–52.0)
Hemoglobin: 10.8 g/dL — ABNORMAL LOW (ref 13.0–17.0)
Immature Granulocytes: 0 %
Lymphocytes Relative: 14 %
Lymphs Abs: 0.9 10*3/uL (ref 0.7–4.0)
MCH: 28.6 pg (ref 26.0–34.0)
MCHC: 30.6 g/dL (ref 30.0–36.0)
MCV: 93.6 fL (ref 80.0–100.0)
Monocytes Absolute: 0.7 10*3/uL (ref 0.1–1.0)
Monocytes Relative: 11 %
Neutro Abs: 5.1 10*3/uL (ref 1.7–7.7)
Neutrophils Relative %: 74 %
Platelets: 228 10*3/uL (ref 150–400)
RBC: 3.77 MIL/uL — ABNORMAL LOW (ref 4.22–5.81)
RDW: 15.7 % — ABNORMAL HIGH (ref 11.5–15.5)
WBC: 6.8 10*3/uL (ref 4.0–10.5)
nRBC: 0 % (ref 0.0–0.2)

## 2020-12-05 LAB — BASIC METABOLIC PANEL
Anion gap: 5 (ref 5–15)
BUN: 23 mg/dL (ref 8–23)
CO2: 26 mmol/L (ref 22–32)
Calcium: 8.6 mg/dL — ABNORMAL LOW (ref 8.9–10.3)
Chloride: 109 mmol/L (ref 98–111)
Creatinine, Ser: 1.19 mg/dL (ref 0.61–1.24)
GFR, Estimated: 57 mL/min — ABNORMAL LOW (ref >60)
Glucose, Bld: 112 mg/dL — ABNORMAL HIGH (ref 70–99)
Potassium: 3.6 mmol/L (ref 3.5–5.1)
Sodium: 140 mmol/L (ref 135–145)

## 2020-12-05 LAB — D-DIMER, QUANTITATIVE: D-Dimer, Quant: 0.95 ug/mL-FEU — ABNORMAL HIGH (ref 0.00–0.50)

## 2020-12-05 MED ORDER — ACETAMINOPHEN 500 MG PO TABS
500.0000 mg | ORAL_TABLET | Freq: Once | ORAL | Status: AC
  Administered 2020-12-05: 500 mg via ORAL
  Filled 2020-12-05: qty 1

## 2020-12-05 NOTE — Discharge Instructions (Addendum)
You have likely significant sprain of your right knee which has caused fluid collection.  This may be from overuse.  I recommend that you apply ice packs on and off to your knee to help with swelling.  Wear the knee brace for support when walking or standing.  You will need to rest your knee this week.  Take 1 extra strength Tylenol every 4-6 hours if needed for pain.  You may follow-up with the orthopedic provider of your choice.  Return to the emergency department for any new or worsening symptoms.
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
## 2020-12-05 NOTE — ED Triage Notes (Addendum)
Patient c/o right thigh, rt knee, and rt calf pain that started yesterday and is progressively getting worse. Patient states pain is worse with walking. Denies any known injury. Per daughter patient down in floor on his last night to plug something in and was almost unable to stand up after. Patient takes Xarelto for afib.

## 2021-01-22 DIAGNOSIS — Z79899 Other long term (current) drug therapy: Secondary | ICD-10-CM | POA: Diagnosis not present

## 2021-01-22 DIAGNOSIS — N1832 Chronic kidney disease, stage 3b: Secondary | ICD-10-CM | POA: Diagnosis not present

## 2021-01-22 DIAGNOSIS — I10 Essential (primary) hypertension: Secondary | ICD-10-CM | POA: Diagnosis not present

## 2021-01-22 DIAGNOSIS — E875 Hyperkalemia: Secondary | ICD-10-CM | POA: Diagnosis not present

## 2021-01-26 DIAGNOSIS — E039 Hypothyroidism, unspecified: Secondary | ICD-10-CM | POA: Diagnosis not present

## 2021-01-26 DIAGNOSIS — I4821 Permanent atrial fibrillation: Secondary | ICD-10-CM | POA: Diagnosis not present

## 2021-01-26 DIAGNOSIS — Z23 Encounter for immunization: Secondary | ICD-10-CM | POA: Diagnosis not present

## 2021-01-26 DIAGNOSIS — N1832 Chronic kidney disease, stage 3b: Secondary | ICD-10-CM | POA: Diagnosis not present

## 2021-04-23 DIAGNOSIS — E875 Hyperkalemia: Secondary | ICD-10-CM | POA: Diagnosis not present

## 2021-04-23 DIAGNOSIS — E039 Hypothyroidism, unspecified: Secondary | ICD-10-CM | POA: Diagnosis not present

## 2021-04-23 DIAGNOSIS — N1832 Chronic kidney disease, stage 3b: Secondary | ICD-10-CM | POA: Diagnosis not present

## 2021-04-29 DIAGNOSIS — N1832 Chronic kidney disease, stage 3b: Secondary | ICD-10-CM | POA: Diagnosis not present

## 2021-04-29 DIAGNOSIS — E030 Congenital hypothyroidism with diffuse goiter: Secondary | ICD-10-CM | POA: Diagnosis not present

## 2021-04-29 DIAGNOSIS — D696 Thrombocytopenia, unspecified: Secondary | ICD-10-CM | POA: Diagnosis not present

## 2021-04-29 DIAGNOSIS — E875 Hyperkalemia: Secondary | ICD-10-CM | POA: Diagnosis not present

## 2021-05-06 DIAGNOSIS — E875 Hyperkalemia: Secondary | ICD-10-CM | POA: Diagnosis not present

## 2021-05-06 DIAGNOSIS — E785 Hyperlipidemia, unspecified: Secondary | ICD-10-CM | POA: Diagnosis not present

## 2021-05-06 DIAGNOSIS — I10 Essential (primary) hypertension: Secondary | ICD-10-CM | POA: Diagnosis not present

## 2021-05-06 DIAGNOSIS — Z0001 Encounter for general adult medical examination with abnormal findings: Secondary | ICD-10-CM | POA: Diagnosis not present

## 2021-05-25 DIAGNOSIS — R2243 Localized swelling, mass and lump, lower limb, bilateral: Secondary | ICD-10-CM | POA: Diagnosis not present

## 2021-05-25 DIAGNOSIS — I4821 Permanent atrial fibrillation: Secondary | ICD-10-CM | POA: Diagnosis not present

## 2021-06-11 ENCOUNTER — Emergency Department (HOSPITAL_COMMUNITY): Payer: Medicare Other

## 2021-06-11 ENCOUNTER — Inpatient Hospital Stay (HOSPITAL_COMMUNITY)
Admission: EM | Admit: 2021-06-11 | Discharge: 2021-06-14 | DRG: 951 | Disposition: A | Discharge status: Hospice | Payer: Medicare Other | Attending: Family Medicine | Admitting: Family Medicine

## 2021-06-11 ENCOUNTER — Encounter (HOSPITAL_COMMUNITY): Payer: Self-pay | Admitting: Emergency Medicine

## 2021-06-11 DIAGNOSIS — S065XAA Traumatic subdural hemorrhage with loss of consciousness status unknown, initial encounter: Secondary | ICD-10-CM

## 2021-06-11 DIAGNOSIS — Z79899 Other long term (current) drug therapy: Secondary | ICD-10-CM | POA: Diagnosis not present

## 2021-06-11 DIAGNOSIS — E782 Mixed hyperlipidemia: Secondary | ICD-10-CM | POA: Diagnosis present

## 2021-06-11 DIAGNOSIS — R0681 Apnea, not elsewhere classified: Secondary | ICD-10-CM | POA: Diagnosis present

## 2021-06-11 DIAGNOSIS — H409 Unspecified glaucoma: Secondary | ICD-10-CM | POA: Diagnosis present

## 2021-06-11 DIAGNOSIS — Z66 Do not resuscitate: Secondary | ICD-10-CM | POA: Diagnosis present

## 2021-06-11 DIAGNOSIS — Z515 Encounter for palliative care: Principal | ICD-10-CM

## 2021-06-11 DIAGNOSIS — I6200 Nontraumatic subdural hemorrhage, unspecified: Secondary | ICD-10-CM | POA: Diagnosis present

## 2021-06-11 DIAGNOSIS — Z7901 Long term (current) use of anticoagulants: Secondary | ICD-10-CM

## 2021-06-11 DIAGNOSIS — I4891 Unspecified atrial fibrillation: Secondary | ICD-10-CM | POA: Diagnosis present

## 2021-06-11 DIAGNOSIS — J90 Pleural effusion, not elsewhere classified: Secondary | ICD-10-CM | POA: Diagnosis present

## 2021-06-11 DIAGNOSIS — I10 Essential (primary) hypertension: Secondary | ICD-10-CM | POA: Diagnosis present

## 2021-06-11 DIAGNOSIS — I618 Other nontraumatic intracerebral hemorrhage: Secondary | ICD-10-CM | POA: Diagnosis present

## 2021-06-11 DIAGNOSIS — Z8673 Personal history of transient ischemic attack (TIA), and cerebral infarction without residual deficits: Secondary | ICD-10-CM | POA: Diagnosis not present

## 2021-06-11 DIAGNOSIS — M1990 Unspecified osteoarthritis, unspecified site: Secondary | ICD-10-CM | POA: Diagnosis present

## 2021-06-11 DIAGNOSIS — I482 Chronic atrial fibrillation, unspecified: Secondary | ICD-10-CM | POA: Diagnosis present

## 2021-06-11 DIAGNOSIS — Z880 Allergy status to penicillin: Secondary | ICD-10-CM

## 2021-06-11 DIAGNOSIS — J849 Interstitial pulmonary disease, unspecified: Secondary | ICD-10-CM | POA: Diagnosis not present

## 2021-06-11 DIAGNOSIS — D649 Anemia, unspecified: Secondary | ICD-10-CM | POA: Diagnosis present

## 2021-06-11 DIAGNOSIS — Z85828 Personal history of other malignant neoplasm of skin: Secondary | ICD-10-CM

## 2021-06-11 DIAGNOSIS — I619 Nontraumatic intracerebral hemorrhage, unspecified: Secondary | ICD-10-CM | POA: Diagnosis present

## 2021-06-11 DIAGNOSIS — E7800 Pure hypercholesterolemia, unspecified: Secondary | ICD-10-CM | POA: Diagnosis present

## 2021-06-11 DIAGNOSIS — G935 Compression of brain: Secondary | ICD-10-CM | POA: Diagnosis present

## 2021-06-11 DIAGNOSIS — I611 Nontraumatic intracerebral hemorrhage in hemisphere, cortical: Secondary | ICD-10-CM | POA: Diagnosis present

## 2021-06-11 DIAGNOSIS — S06329A Contusion and laceration of left cerebrum with loss of consciousness of unspecified duration, initial encounter: Secondary | ICD-10-CM | POA: Diagnosis not present

## 2021-06-11 DIAGNOSIS — S0633AA Contusion and laceration of cerebrum, unspecified, with loss of consciousness status unknown, initial encounter: Secondary | ICD-10-CM | POA: Diagnosis present

## 2021-06-11 DIAGNOSIS — Z751 Person awaiting admission to adequate facility elsewhere: Secondary | ICD-10-CM | POA: Diagnosis not present

## 2021-06-11 DIAGNOSIS — S06369A Traumatic hemorrhage of cerebrum, unspecified, with loss of consciousness of unspecified duration, initial encounter: Secondary | ICD-10-CM | POA: Diagnosis not present

## 2021-06-11 DIAGNOSIS — R9431 Abnormal electrocardiogram [ECG] [EKG]: Secondary | ICD-10-CM | POA: Diagnosis not present

## 2021-06-11 LAB — COMPREHENSIVE METABOLIC PANEL
ALT: 12 U/L (ref 0–44)
AST: 24 U/L (ref 15–41)
Albumin: 3.8 g/dL (ref 3.5–5.0)
Alkaline Phosphatase: 71 U/L (ref 38–126)
Anion gap: 6 (ref 5–15)
BUN: 27 mg/dL — ABNORMAL HIGH (ref 8–23)
CO2: 24 mmol/L (ref 22–32)
Calcium: 9.2 mg/dL (ref 8.9–10.3)
Chloride: 112 mmol/L — ABNORMAL HIGH (ref 98–111)
Creatinine, Ser: 1.38 mg/dL — ABNORMAL HIGH (ref 0.61–1.24)
GFR, Estimated: 48 mL/min — ABNORMAL LOW (ref >60)
Glucose, Bld: 134 mg/dL — ABNORMAL HIGH (ref 70–99)
Potassium: 4.6 mmol/L (ref 3.5–5.1)
Sodium: 142 mmol/L (ref 135–145)
Total Bilirubin: 0.7 mg/dL (ref 0.3–1.2)
Total Protein: 6.3 g/dL — ABNORMAL LOW (ref 6.5–8.1)

## 2021-06-11 LAB — CBC WITH DIFFERENTIAL/PLATELET
Abs Immature Granulocytes: 0.04 10*3/uL (ref 0.00–0.07)
Basophils Absolute: 0 10*3/uL (ref 0.0–0.1)
Basophils Relative: 0 %
Eosinophils Absolute: 0.1 10*3/uL (ref 0.0–0.5)
Eosinophils Relative: 1 %
HCT: 37.8 % — ABNORMAL LOW (ref 39.0–52.0)
Hemoglobin: 11.6 g/dL — ABNORMAL LOW (ref 13.0–17.0)
Immature Granulocytes: 1 %
Lymphocytes Relative: 9 %
Lymphs Abs: 0.8 10*3/uL (ref 0.7–4.0)
MCH: 28.4 pg (ref 26.0–34.0)
MCHC: 30.7 g/dL (ref 30.0–36.0)
MCV: 92.4 fL (ref 80.0–100.0)
Monocytes Absolute: 0.5 10*3/uL (ref 0.1–1.0)
Monocytes Relative: 5 %
Neutro Abs: 7.2 10*3/uL (ref 1.7–7.7)
Neutrophils Relative %: 84 %
Platelets: 220 10*3/uL (ref 150–400)
RBC: 4.09 MIL/uL — ABNORMAL LOW (ref 4.22–5.81)
RDW: 16.4 % — ABNORMAL HIGH (ref 11.5–15.5)
WBC: 8.6 10*3/uL (ref 4.0–10.5)
nRBC: 0 % (ref 0.0–0.2)

## 2021-06-11 LAB — PROTIME-INR
INR: 1.4 — ABNORMAL HIGH (ref 0.8–1.2)
Prothrombin Time: 16.6 seconds — ABNORMAL HIGH (ref 11.4–15.2)

## 2021-06-11 LAB — APTT: aPTT: 34 seconds (ref 24–36)

## 2021-06-11 LAB — LACTIC ACID, PLASMA: Lactic Acid, Venous: 1.5 mmol/L (ref 0.5–1.9)

## 2021-06-11 LAB — TROPONIN I (HIGH SENSITIVITY): Troponin I (High Sensitivity): 31 ng/L — ABNORMAL HIGH (ref <18)

## 2021-06-11 MED ORDER — NALOXONE HCL 2 MG/2ML IJ SOSY
PREFILLED_SYRINGE | INTRAMUSCULAR | Status: AC
  Administered 2021-06-11: 1 mg via INTRAVENOUS
  Filled 2021-06-11: qty 2

## 2021-06-11 MED ORDER — SODIUM CHLORIDE 0.9 % IV BOLUS
1000.0000 mL | Freq: Once | INTRAVENOUS | Status: AC
  Administered 2021-06-11: 1000 mL via INTRAVENOUS

## 2021-06-11 MED ORDER — NALOXONE HCL 2 MG/2ML IJ SOSY: 1.0000 mg | PREFILLED_SYRINGE | Freq: Once | INTRAMUSCULAR | Status: AC

## 2021-06-11 MED ORDER — MORPHINE 100MG IN NS 100ML (1MG/ML) PREMIX INFUSION
1.0000 mg/h | INTRAVENOUS | Status: DC
  Administered 2021-06-11: 1 mg/h via INTRAVENOUS
  Filled 2021-06-11: qty 100

## 2021-06-11 MED ORDER — ONDANSETRON HCL 4 MG/2ML IJ SOLN
4.0000 mg | Freq: Once | INTRAMUSCULAR | Status: AC
  Administered 2021-06-11: 4 mg via INTRAVENOUS
  Filled 2021-06-11: qty 2

## 2021-06-11 MED ORDER — EMPTY CONTAINERS FLEXIBLE MISC
1800.0000 mg | Freq: Once | Status: DC
  Filled 2021-06-11: qty 180

## 2021-06-11 NOTE — ED Provider Notes (Signed)
?[facility] ?Provider Note ? ? ?CSN: [id number] ?Arrival date & time: 06/11/21  1933 ? ?  ? ?History ? ?Chief Complaint  ?Patient presents with  ? Loss of Consciousness  ? ? ?Joseph Stephenson is a 88 y.o. male. ? ?Patient was last seen at 6 PM last night.  When his family went to see him he was unresponsive.  Patient has a history of stroke before, patient has atrial fibs and is taking Xarelto ? ?The history is provided by a relative and the EMS personnel. No language interpreter was used.  ?Altered Mental Status ?Presenting symptoms: behavior changes   ?Severity:  Severe ?Most recent episode:  Today ?Episode history:  Single ?Timing:  Constant ?Progression:  Worsening ?Chronicity:  New ?Context: not alcohol use   ?Associated symptoms: no abdominal pain   ? ?  ? ?Home Medications ?Prior to Admission medications   ?Medication Sig Start Date End Date Taking? Authorizing Provider  ?atorvastatin (LIPITOR) 10 MG tablet Take 10 mg by mouth daily.    [provider]  ?carboxymethylcellulose (REFRESH PLUS) 0.5 % SOLN Place 1 drop into both eyes in the morning, at noon, and at bedtime.    [provider]  ?diltiazem (TIAZAC) 180 MG 24 hr capsule Take 1 capsule by mouth daily.    [provider]  ?docusate sodium (COLACE) 100 MG capsule Take 100 mg by mouth 2 (two) times daily.    [provider]  ?labetalol (NORMODYNE) 300 MG tablet Take 150 mg by mouth 2 (two) times daily.    [provider]  ?latanoprost (XALATAN) 0.005 % ophthalmic solution Place 1 drop into both eyes at bedtime.    [provider]  ?Multiple Vitamins-Minerals (ICAPS AREDS 2 PO) Take 1 capsule by mouth 2 (two) times daily.    [provider]  ?Rivaroxaban (XARELTO) 15 MG TABS tablet Take 15 mg by mouth daily.    [provider]  ?sodium polystyrene (KAYEXALATE) 15 GM/60ML suspension Take 30 g by mouth See admin instructions. 30 grams at bedtime on Monday, Wednesday  and Friday    [provider]  ?   ? ?Allergies    ?Penicillins   ? ?Review of Systems   ?Review of Systems  ?Unable to perform ROS: Mental status change  ?Gastrointestinal:  Negative for abdominal pain.  ? ?Physical Exam ?Updated Vital Signs ?BP (!) 170/107   Pulse (!) 103   Temp (!) 97.4 ?F (36.3 ?C) (Rectal)   Resp (!) 23   SpO2 100%  ?Physical Exam ?Vitals and nursing note reviewed.  ?Constitutional:   ?   Appearance: He is well-developed.  ?HENT:  ?   Head: Normocephalic.  ?   Nose: Nose normal.  ?Eyes:  ?   General: No scleral icterus. ?   Conjunctiva/sclera: Conjunctivae normal.  ?Neck:  ?   Thyroid: No thyromegaly.  ?Cardiovascular:  ?   Rate and Rhythm: Normal rate and regular rhythm.  ?   Heart sounds: No murmur heard. ?  No friction rub. No gallop.  ?Pulmonary:  ?   Breath sounds: No stridor. No wheezing or rales.  ?Chest:  ?   Chest wall: No tenderness.  ?Abdominal:  ?   General: There is no distension.  ?   Tenderness: There is no abdominal tenderness. There is no rebound.  ?Musculoskeletal:     ?   General: Normal range of motion.  ?   Cervical back: Neck supple.  ?Lymphadenopathy:  ?  Cervical: No cervical adenopathy.  ?Skin: ?   Findings: No erythema or rash.  ?Neurological:  ?   Mental Status: He is alert.  ?   Motor: No abnormal muscle tone.  ?   Coordination: Coordination normal.  ?   Comments: Patient only responding to painful stimuli by moving extremities  ?Psychiatric:     ?   Behavior: Behavior normal.  ? ? ?ED Results / Procedures / Treatments   ?Labs ?(all labs ordered are listed, but only abnormal results are displayed) ?Labs Reviewed  ?COMPREHENSIVE METABOLIC PANEL - Abnormal; Notable for the following components:  ?    Result Value  ? Chloride 112 (*)   ? Glucose, Bld 134 (*)   ? BUN 27 (*)   ? Creatinine, Ser 1.38 (*)   ? Total Protein 6.3 (*)   ? GFR, Estimated 48 (*)   ? All other components within normal limits  ?CBC WITH DIFFERENTIAL/PLATELET - Abnormal; Notable for  the following components:  ? RBC 4.09 (*)   ? Hemoglobin 11.6 (*)   ? HCT 37.8 (*)   ? RDW 16.4 (*)   ? All other components within normal limits  ?PROTIME-INR - Abnormal; Notable for the following components:  ? Prothrombin Time 16.6 (*)   ? INR 1.4 (*)   ? All other components within normal limits  ?TROPONIN I (HIGH SENSITIVITY) - Abnormal; Notable for the following components:  ? Troponin I (High Sensitivity) 31 (*)   ? All other components within normal limits  ?CULTURE, BLOOD (ROUTINE X 2)  ?CULTURE, BLOOD (ROUTINE X 2)  ?URINE CULTURE  ?LACTIC ACID, PLASMA  ?APTT  ?URINALYSIS, ROUTINE W REFLEX MICROSCOPIC  ? ? ?EKG ?None ? ?Radiology ?CT Head Wo Contrast ? ?Result Date: 06/11/2021 ?CLINICAL DATA:  Found unconscious EXAM: CT HEAD WITHOUT CONTRAST TECHNIQUE: Contiguous axial images were obtained from the base of the skull through the vertex without intravenous contrast. RADIATION DOSE REDUCTION: This exam was performed according to the departmental dose-optimization program which includes automated exposure control, adjustment of the mA and/or kV according to patient size and/or use of iterative reconstruction technique. COMPARISON:  None. FINDINGS: Brain: Large intraparenchymal hematoma in the left frontal lobe measuring 5.9 x 4.5 x 3.8 cm. There is rightward midline shift 8 mm with subfalcine herniation of the cingulate gyrus. There is a left-sided subdural hematoma of mixed density measuring 7 mm thick. Left lateral ventricle is effaced. Basal cisterns remain patent. Vascular: Calcification of the carotid arteries at the skull base Skull: Unremarkable Sinuses/Orbits: Normal orbits.  Paranasal sinuses are clear. Other: None IMPRESSION: 1. Large intraparenchymal hematoma in the left frontal lobe measuring 5.9 x 4.5 x 3.8 cm, with 8 mm rightward midline shift and subfalcine herniation of the cingulate gyrus. 2. Left-sided subdural hematoma of mixed density measuring 7 mm thick. Critical Value/emergent results were  called by telephone at the time of interpretation on 06/11/2021 at 9:01 pm to provider Sabrinna Yearwood , who verbally acknowledged these results. Electronically Signed   By: Ulyses Jarred M.D.   On: 06/11/2021 21:01  ? ?DG Chest Port 1 View ? ?Result Date: 06/11/2021 ?CLINICAL DATA:  Possible aspiration EXAM: PORTABLE CHEST 1 VIEW COMPARISON:  Film from 30 minutes previous FINDINGS: Cardiac shadow remains enlarged. Aortic calcifications are again seen. Lungs are well aerated bilaterally with patchy interstitial markings bilaterally stable from the prior exam. No new basilar changes to suggest aspiration are noted. No bony abnormality is seen. IMPRESSION: Stable appearance from 30 minutes previous.  No findings to suggest aspiration are noted. Electronically Signed   By: Inez Catalina M.D.   On: 06/11/2021 21:22  ? ?DG Chest Port 1 View ? ?Result Date: 06/11/2021 ?CLINICAL DATA:  Possible sepsis, altered mental status, lethargy EXAM: PORTABLE CHEST 1 VIEW COMPARISON:  None. FINDINGS: Transverse diameter of heart is increased. Central pulmonary vessels are prominent. There is slight prominence of interstitial markings in the parahilar regions and lower lung fields. There is blunting of right lateral CP angle. There is no pneumothorax. Degenerative changes are noted in the left shoulder. IMPRESSION: Cardiomegaly. Increased interstitial markings in the parahilar regions and lower lung fields may suggest interstitial pulmonary edema or interstitial pneumonia. Small right pleural effusion. Electronically Signed   By: Elmer Picker M.D.   On: 06/11/2021 20:38   ? ?Procedures ?Procedures  ? ? ?Medications Ordered in ED ?Medications  ?coag fact Xa recombinant (ANDEXXA) high dose infusion 1800 mg (1,800 mg Intravenous Not Given 06/11/21 2148)  ?sodium chloride 0.9 % bolus 1,000 mL (0 mLs Intravenous Stopped 06/11/21 2149)  ?naloxone [facility]) injection 1 mg (1 mg Intravenous Given 06/11/21 2044)  ?ondansetron [facility]) injection 4 mg (4  mg Intravenous Given 06/11/21 2155)  ? ? ?ED Course/ Medical Decision Making/ A&P ? CRITICAL CARE ?Performed by: Milton Ferguson ?Total critical care time: 45 minutes ?Critical care time was exclusive of separ

## 2021-06-11 NOTE — ED Notes (Signed)
Paged Neuro surgeon 2x ?

## 2021-06-11 NOTE — ED Triage Notes (Signed)
Family called ems due to pt being unconscious. Was alert with ems upon arrival and tries to follow some commands. Bs in route 158. Pt arrived to ED with eyes open and moving all extremities. Lethargy noted. Edp at bedside upon arrival and no stroke called. LKW last night at 6pm by family during dinner time Pt lives alone.  ?

## 2021-06-11 NOTE — Final Consult Note (Signed)
I was contacted by Dr. Roderic Palau regarding this patient.  He is a 89 year old white male on Xarelto who was found unconscious this afternoon.  He was brought to [facility], ER.  He was worked up with a head CT which demonstrated a large left frontal intracerebral hemorrhage, left subdural hematoma, significant midline shift. ? ?I discussed the situation with Dr. Roderic Palau.  Even with aggressive care I think the chances of meaningful survival with this hemorrhage and at his age are minimal.  Therefore, I would suggest comfort care measures only.  Please call if I can be of further assistance. ?

## 2021-06-12 ENCOUNTER — Other Ambulatory Visit: Payer: Self-pay

## 2021-06-12 DIAGNOSIS — R9431 Abnormal electrocardiogram [ECG] [EKG]: Secondary | ICD-10-CM

## 2021-06-12 DIAGNOSIS — J90 Pleural effusion, not elsewhere classified: Secondary | ICD-10-CM

## 2021-06-12 DIAGNOSIS — E782 Mixed hyperlipidemia: Secondary | ICD-10-CM

## 2021-06-12 DIAGNOSIS — I10 Essential (primary) hypertension: Secondary | ICD-10-CM

## 2021-06-12 DIAGNOSIS — S06329A Contusion and laceration of left cerebrum with loss of consciousness of unspecified duration, initial encounter: Secondary | ICD-10-CM

## 2021-06-12 DIAGNOSIS — J849 Interstitial pulmonary disease, unspecified: Secondary | ICD-10-CM

## 2021-06-12 DIAGNOSIS — S065XAA Traumatic subdural hemorrhage with loss of consciousness status unknown, initial encounter: Secondary | ICD-10-CM

## 2021-06-12 DIAGNOSIS — I482 Chronic atrial fibrillation, unspecified: Secondary | ICD-10-CM

## 2021-06-12 MED ORDER — SCOPOLAMINE 1 MG/3DAYS TD PT72
1.0000 | MEDICATED_PATCH | TRANSDERMAL | Status: DC
  Administered 2021-06-12: 1.5 mg via TRANSDERMAL
  Filled 2021-06-12: qty 1

## 2021-06-12 NOTE — Progress Notes (Addendum)
Patient seen and evaluated, chart reviewed, please see EMR for updated orders. Please see full H&P dictated by admitting physician Dr. Josephine Cables for same date of service.  ? ?- ?Patient appears comfortable on exam, having apneic episodes ? ?A/p ?Left-sided intraparenchymal hematoma of brain ?Subdural hematoma ?Neurosurgeon (Dr. Arnoldo Morale) was consulted and recommended comfort care based on size of bleed and minimal chances of surgical survival ?-Continue comfort care ?-Anticipate in-hospital death ?-Discussed with family at bedside, questions answered ? ?-Chart reviewed, total care time 36 minutes ? ?Roxan Hockey, MD ? ? ?
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
## 2021-06-13 MED ORDER — BIOTENE DRY MOUTH MT LIQD: 15.0000 mL | OROMUCOSAL | Status: DC | PRN

## 2021-06-13 MED ORDER — ONDANSETRON 4 MG PO TBDP: 4.0000 mg | ORAL_TABLET | Freq: Four times a day (QID) | ORAL | Status: DC | PRN

## 2021-06-13 MED ORDER — SODIUM CHLORIDE 0.9 % IV SOLN
12.5000 mg | Freq: Four times a day (QID) | INTRAVENOUS | Status: DC | PRN
  Filled 2021-06-13: qty 0.5

## 2021-06-13 MED ORDER — POLYVINYL ALCOHOL 1.4 % OP SOLN: 1.0000 [drp] | Freq: Four times a day (QID) | OPHTHALMIC | Status: DC | PRN

## 2021-06-13 MED ORDER — LORAZEPAM 2 MG/ML IJ SOLN
1.0000 mg | INTRAMUSCULAR | Status: DC | PRN
Start: 2021-06-13 — End: 2021-06-14

## 2021-06-13 MED ORDER — ONDANSETRON HCL 4 MG/2ML IJ SOLN: 4.0000 mg | Freq: Four times a day (QID) | INTRAMUSCULAR | Status: DC | PRN

## 2021-06-13 MED ORDER — MORPHINE SULFATE (PF) 2 MG/ML IV SOLN
2.0000 mg | INTRAVENOUS | Status: DC | PRN
Start: 2021-06-13 — End: 2021-06-14

## 2021-06-13 NOTE — Plan of Care (Signed)
  Problem: Education: Goal: Knowledge of General Education information will improve Description: Including pain rating scale, medication(s)/side effects and non-pharmacologic comfort measures Outcome: Not Progressing   Problem: Health Behavior/Discharge Planning: Goal: Ability to manage health-related needs will improve Outcome: Not Progressing   

## 2021-06-13 NOTE — Progress Notes (Signed)
?  Brief Summary:- ?88 y.o. male with medical history significant for hyperlipidemia, hypertension, atrial fibrillation on Xarelto and glaucoma admitted on 06/12/2021 with intracranial hemorrhage ? ? ?A/p ?Left-sided intraparenchymal hematoma of brain ?Subdural hematoma ?Neurosurgeon (Dr. Arnoldo Morale) was consulted and recommended comfort care based on size of bleed and minimal chances of surgical survival ?-Patient is resting comfortably with apneic episode ?-- continue IV morphine drip/palliative/comfort care protocols ?-Anticipate in-hospital death ?-Discussed with family at bedside, questions answered ? ?Roxan Hockey, MD ? ?

## 2021-06-14 ENCOUNTER — Inpatient Hospital Stay (HOSPITAL_COMMUNITY)
Admission: RE | Admit: 2021-06-14 | Discharge: 2021-07-05 | DRG: 951 | Disposition: E | Source: Hospice | Attending: Family Medicine | Admitting: Family Medicine

## 2021-06-14 ENCOUNTER — Inpatient Hospital Stay (HOSPITAL_COMMUNITY)
Admission: RE | Admit: 2021-06-14 | Discharge: 2021-06-14 | DRG: 951 | Disposition: A | Discharge status: Hospice | Source: Hospice | Attending: Family Medicine | Admitting: Family Medicine

## 2021-06-14 DIAGNOSIS — Z751 Person awaiting admission to adequate facility elsewhere: Secondary | ICD-10-CM

## 2021-06-14 DIAGNOSIS — S06369A Traumatic hemorrhage of cerebrum, unspecified, with loss of consciousness of unspecified duration, initial encounter: Secondary | ICD-10-CM

## 2021-06-14 DIAGNOSIS — H409 Unspecified glaucoma: Secondary | ICD-10-CM | POA: Diagnosis present

## 2021-06-14 DIAGNOSIS — I619 Nontraumatic intracerebral hemorrhage, unspecified: Principal | ICD-10-CM | POA: Diagnosis present

## 2021-06-14 DIAGNOSIS — Z515 Encounter for palliative care: Principal | ICD-10-CM

## 2021-06-14 DIAGNOSIS — Z8673 Personal history of transient ischemic attack (TIA), and cerebral infarction without residual deficits: Secondary | ICD-10-CM

## 2021-06-14 DIAGNOSIS — Z79899 Other long term (current) drug therapy: Secondary | ICD-10-CM

## 2021-06-14 DIAGNOSIS — I10 Essential (primary) hypertension: Secondary | ICD-10-CM | POA: Diagnosis present

## 2021-06-14 DIAGNOSIS — E7800 Pure hypercholesterolemia, unspecified: Secondary | ICD-10-CM | POA: Diagnosis present

## 2021-06-14 DIAGNOSIS — I4891 Unspecified atrial fibrillation: Secondary | ICD-10-CM | POA: Diagnosis present

## 2021-06-14 DIAGNOSIS — Z880 Allergy status to penicillin: Secondary | ICD-10-CM

## 2021-06-14 DIAGNOSIS — Z85828 Personal history of other malignant neoplasm of skin: Secondary | ICD-10-CM

## 2021-06-14 DIAGNOSIS — Z7901 Long term (current) use of anticoagulants: Secondary | ICD-10-CM

## 2021-06-14 DIAGNOSIS — S065XAA Traumatic subdural hemorrhage with loss of consciousness status unknown, initial encounter: Secondary | ICD-10-CM | POA: Diagnosis present

## 2021-06-14 DIAGNOSIS — I618 Other nontraumatic intracerebral hemorrhage: Secondary | ICD-10-CM | POA: Diagnosis present

## 2021-06-14 MED ORDER — GLYCOPYRROLATE 0.2 MG/ML IJ SOLN
0.2000 mg | INTRAMUSCULAR | Status: DC | PRN
  Administered 2021-06-15 (×2): 0.2 mg via INTRAVENOUS
  Filled 2021-06-14 (×2): qty 1

## 2021-06-14 MED ORDER — LORAZEPAM 2 MG/ML IJ SOLN
1.0000 mg | INTRAMUSCULAR | Status: DC | PRN
  Administered 2021-06-15: 1 mg via INTRAVENOUS
  Filled 2021-06-14: qty 1

## 2021-06-14 MED ORDER — MORPHINE 100MG IN NS 100ML (1MG/ML) PREMIX INFUSION
1.0000 mg/h | INTRAVENOUS | Status: DC
  Administered 2021-06-15: 1 mg/h via INTRAVENOUS
  Filled 2021-06-14: qty 100

## 2021-06-14 MED ORDER — HALOPERIDOL LACTATE 5 MG/ML IJ SOLN: 0.5000 mg | INTRAMUSCULAR | Status: DC | PRN

## 2021-06-14 MED ORDER — ONDANSETRON HCL 4 MG/2ML IJ SOLN: 4.0000 mg | Freq: Four times a day (QID) | INTRAMUSCULAR | Status: DC | PRN

## 2021-06-14 MED ORDER — HALOPERIDOL LACTATE 2 MG/ML PO CONC
0.5000 mg | ORAL | Status: DC | PRN
  Filled 2021-06-14: qty 0.3

## 2021-06-14 MED ORDER — ACETAMINOPHEN 325 MG PO TABS: 650.0000 mg | ORAL_TABLET | Freq: Four times a day (QID) | ORAL | Status: DC | PRN

## 2021-06-14 MED ORDER — MORPHINE BOLUS VIA INFUSION
2.0000 mg | INTRAVENOUS | Status: DC | PRN
  Administered 2021-06-15: 2 mg via INTRAVENOUS
  Filled 2021-06-14: qty 2

## 2021-06-14 MED ORDER — SODIUM CHLORIDE 0.9% FLUSH: 3.0000 mL | INTRAVENOUS | Status: DC | PRN

## 2021-06-14 MED ORDER — BIOTENE DRY MOUTH MT LIQD: 15.0000 mL | OROMUCOSAL | Status: DC | PRN

## 2021-06-14 MED ORDER — GLYCOPYRROLATE 1 MG PO TABS: 1.0000 mg | ORAL_TABLET | ORAL | Status: DC | PRN

## 2021-06-14 MED ORDER — GLYCOPYRROLATE 0.2 MG/ML IJ SOLN: 0.2000 mg | INTRAMUSCULAR | Status: DC | PRN

## 2021-06-14 MED ORDER — GLYCOPYRROLATE 0.2 MG/ML IJ SOLN
0.2000 mg | Freq: Three times a day (TID) | INTRAMUSCULAR | Status: DC
  Administered 2021-06-14 (×2): 0.2 mg via INTRAVENOUS
  Filled 2021-06-14 (×2): qty 1

## 2021-06-14 MED ORDER — SODIUM CHLORIDE 0.9% FLUSH: 3.0000 mL | Freq: Two times a day (BID) | INTRAVENOUS | Status: DC

## 2021-06-14 MED ORDER — LORAZEPAM 2 MG/ML PO CONC
1.0000 mg | ORAL | Status: DC | PRN
  Filled 2021-06-14: qty 0.5

## 2021-06-14 MED ORDER — SODIUM CHLORIDE 0.9 % IV SOLN: 250.0000 mL | INTRAVENOUS | Status: DC | PRN

## 2021-06-14 MED ORDER — ONDANSETRON 4 MG PO TBDP: 4.0000 mg | ORAL_TABLET | Freq: Four times a day (QID) | ORAL | Status: DC | PRN

## 2021-06-14 MED ORDER — ALBUTEROL SULFATE (2.5 MG/3ML) 0.083% IN NEBU: 2.5000 mg | INHALATION_SOLUTION | RESPIRATORY_TRACT | Status: DC | PRN

## 2021-06-14 MED ORDER — HALOPERIDOL 0.5 MG PO TABS: 0.5000 mg | ORAL_TABLET | ORAL | Status: DC | PRN

## 2021-06-14 MED ORDER — POLYVINYL ALCOHOL 1.4 % OP SOLN: 1.0000 [drp] | Freq: Four times a day (QID) | OPHTHALMIC | Status: DC | PRN

## 2021-06-14 MED ORDER — LORAZEPAM 1 MG PO TABS: 1.0000 mg | ORAL_TABLET | ORAL | Status: DC | PRN

## 2021-06-14 MED ORDER — ACETAMINOPHEN 650 MG RE SUPP: 650.0000 mg | Freq: Four times a day (QID) | RECTAL | Status: DC | PRN

## 2021-06-14 NOTE — TOC Progression Note (Signed)
Transition of Care (TOC) - Progression Note  ? ? ?Patient Details  ?Name: MARTEL GALVAN ?MRN: [id number] ?Date of Birth: 29-Sep-1925 ? ?Transition of Care (TOC) CM/SW Contact  ?Salome Arnt, LCSW ?Phone Number: ?07/04/2021, 11:18 AM ? ?Clinical Narrative:  Per MD, refer to [facility] for GIP. Referral made. TOC will follow.   ? ? ? ?  ?  ? ?Expected Discharge Plan and Services ?  ?  ?  ?  ?  ?                ?  ?  ?  ?  ?  ?  ?  ?  ?  ?  ? ? ?Social Determinants of Health (SDOH) Interventions ?  ? ?Readmission Risk Interventions ?   ? View : No data to display.  ?  ?  ?  ? ? ?

## 2021-06-14 NOTE — Care Management Important Message (Signed)
Important Message ? ?Patient Details  ?Name: Joseph Stephenson ?MRN: [id number] ?Date of Birth: [birth year] ? ? ?Medicare Important Message Given:  Other (see comment) ? ?On comfort care measures.  Medicare IM withheld at this time out of respect for patient and family. ? ? ?Dannette Barbara ?07/03/2021, 9:28 AM ?

## 2021-06-14 NOTE — H&P (Signed)
Please see H&P for same date of service ? ?-- Duplicate H&P ? ?Roxan Hockey, MD ? ?

## 2021-06-14 NOTE — TOC Progression Note (Signed)
Transition of Care (TOC) - Progression Note  ? ? ?Patient Details  ?Name: Joseph Stephenson ?MRN: [id number] ?Date of Birth: 1925-10-19 ? ?Transition of Care (TOC) CM/SW Contact  ?Salome Arnt, LCSW ?Phone Number: ?06/13/2021, 3:52 PM ? ?Clinical Narrative:  Per hospice, GIP consents have been signed with family. MD notified to flip chart.    ? ? ? ?  ?  ? ?Expected Discharge Plan and Services ?  ?  ?  ?  ?  ?                ?  ?  ?  ?  ?  ?  ?  ?  ?  ?  ? ? ?Social Determinants of Health (SDOH) Interventions ?  ? ?Readmission Risk Interventions ?   ? View : No data to display.  ?  ?  ?  ? ? ?
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
## 2021-06-15 DIAGNOSIS — S06369A Traumatic hemorrhage of cerebrum, unspecified, with loss of consciousness of unspecified duration, initial encounter: Secondary | ICD-10-CM

## 2021-06-15 NOTE — Progress Notes (Signed)
At beginning of shift I talked with patient's son about repositioning patient. He asked that I leave him since he seems comfortable and would call me if he would like me to reposition patient. ?

## 2021-06-15 NOTE — Progress Notes (Signed)
Assessment and Plan: ?Brief Summary:- ?88 y.o. male with medical history significant for hyperlipidemia, hypertension, atrial fibrillation on Xarelto and glaucoma admitted on 06/12/2021 with intracranial hemorrhage ?  ?A/p ?Left-sided intraparenchymal hematoma of brain ?Subdural hematoma ?Neurosurgeon (Dr. Arnoldo Morale) was consulted and recommended comfort care based on size of bleed and minimal chances of surgical survival ?-Patient is resting comfortably  ?=-No longer having apneic episodes ?-- continue IV morphine drip/palliative/comfort care protocols ?--Patient was evaluated by hospice on 06/15/2021 ?-Patient has accepted to hospice service and transitioned  to hospice/GIP status pending bed availability at residential hospice ?- ?-Patient remains comfortable on palliative/hospice protocol ?-- family at bedside ?-Discussed with family at bedside, questions answered ?-Overall prognosis is grave ?  ?Disposition/Need for in-Hospital Stay- patient unable to be discharged at this time due to ---Hospice pt--awaiting bed availability at residential hospice house ?  ?Status is: Inpatient ?  ?Dispo: The patient is from: Home ?             Anticipated d/c is to:  Awaiting transfer to residential hospice when bed becomes available ?             Anticipated d/c date is: 1 day ?    Barriers: -Awaiting bed availability at residential hospice house ? ?Roxan Hockey, MD ? ?

## 2021-06-15 NOTE — Plan of Care (Signed)
  Problem: Education: Goal: Knowledge of General Education information will improve Description: Including pain rating scale, medication(s)/side effects and non-pharmacologic comfort measures Outcome: Not Progressing   

## 2021-06-15 NOTE — Progress Notes (Signed)
Assessment and Plan: ?Brief Summary:- ?88 y.o. male with medical history significant for hyperlipidemia, hypertension, atrial fibrillation on Xarelto and glaucoma admitted on 06/12/2021 with intracranial hemorrhage ?  ?A/p ?Left-sided intraparenchymal hematoma of brain ?Subdural hematoma ?Neurosurgeon (Dr. Arnoldo Morale) was consulted and recommended comfort care based on size of bleed and minimal chances of surgical survival ?-Patient is resting comfortably with apneic episode ?-- continue IV morphine drip/palliative/comfort care protocols ?--Patient was evaluated by hospice on 06/19/2021 ?-Patient has accepted to hospice service and transitioned  to hospice/GIP status pending bed availability at residential hospice ?- ?-Patient remains comfortable on palliative/hospice protocol ?-- family at bedside ?-Discussed with family at bedside, questions answered ?  ?Disposition/Need for in-Hospital Stay- patient unable to be discharged at this time due to ---Hospice pt--awaiting bed availability at residential hospice house ?  ?Status is: Inpatient ?  ?Dispo: The patient is from: Home ?             Anticipated d/c is to:  Awaiting transfer to residential hospice when bed becomes available ?             Anticipated d/c date is: 1 day ?    Barriers: -Awaiting bed availability at residential hospice house ? ?Roxan Hockey, MD ? ?

## 2021-06-15 NOTE — Progress Notes (Signed)
?   06/15/21 1200  ?Clinical Encounter Type  ?Visited With Patient and family together  ?Visit Type Spiritual support  ?Referral From Palliative care team  ?Consult/Referral To Chaplain  ?Spiritual Encounters  ?Spiritual Needs Grief support;Emotional;Prayer  ?Stress Factors  ?Patient Stress Factors Loss  ?Family Stress Factors Loss  ?Advance Directives (For Healthcare)  ?Does Patient Have a Medical Advance Directive? Unable to assess, patient is non-responsive or altered mental status  ?Mental Health Advance Directives  ?Does Patient Have a Mental Health Advance Directive? Unable to assess, patient is non-responsive or altered mental status  ? ? ?Received referral from Palliative Care Team to provide spiritual support to patient/family as they have elected [place] and patient is at EOL. Chaplain found patient resting comfortably and without signs or symptoms of pain today. Patient daughter bedside and in good spirits. She shared how her father has had a full life and that she is grateful that he was not bedfast for too long before his decline. She shared how their faith is a significant support to her and the family and that she is grateful for the support of the hospital team at this time. No significant spiritual needs noted at this time. Daughter has good external and internal spiritual resources for coping.  ?Chaplain will remain available in order to provide spiritual support and to assess for spiritual need.  ? ?Rev. Bennie Pierini, M.Div.  ?Chaplain  ?[phone] ?

## 2021-06-16 DIAGNOSIS — S06369A Traumatic hemorrhage of cerebrum, unspecified, with loss of consciousness of unspecified duration, initial encounter: Secondary | ICD-10-CM

## 2021-06-16 LAB — CULTURE, BLOOD (ROUTINE X 2)
Culture: NO GROWTH
Culture: NO GROWTH

## 2021-06-17 NOTE — Discharge Summary (Signed)
Brief Summary:- ?88 y.o. male with medical history significant for hyperlipidemia, hypertension, atrial fibrillation on Xarelto and glaucoma admitted on 06/12/2021 with intracranial hemorrhage ?  ?A/p ?Left-sided intraparenchymal hematoma of brain ?Subdural hematoma ?Neurosurgeon (Dr. Arnoldo Morale) was consulted and recommended comfort care based on size of bleed and minimal chances of surgical survival ?-- Patient was treated with iV morphine drip/palliative/comfort care protocols ?--Patient was evaluated by hospice on 06/09/2021 ?-Patient has accepted to hospice service and transitioned  to hospice/GIP status pending bed availability at residential hospice ?-Patient expired on 06/24/2021 at 030 2 AM with family at bedside ? ?-Please see the summary ?

## 2021-06-22 LAB — GLUCOSE, CAPILLARY: Glucose-Capillary: 121 mg/dL — ABNORMAL HIGH (ref 70–99)

## 2021-07-05 NOTE — Progress Notes (Addendum)
Patient family member came to desk and alerted staff that patient had expired.  Staff went into room and patient was pulseless without respirations.  Daughter and son both present in room.  Notified MD and notified supervisor of patient death. IV and Foley catheter removed. Honor bridge notified and gave release of patient. On call hospice nurse notified. ?

## 2021-07-05 NOTE — Progress Notes (Signed)
Morphine gtt wasted by Verlene Mayer RN and  B Keria Widrig RN.  ?

## 2021-07-05 NOTE — Death Summary Note (Signed)
? ?DEATH SUMMARY  ? ?Patient Details  ?Name: Joseph Stephenson ?MRN: [id number] ?DOB: 1925-10-19 ?SPQ:[id number], Carloyn Manner, MD ?Admission/Discharge Information  ? ?Admit Date:  Jul 12, 2021  ?Date of Death:  Jul 14, 2021  ?Time of Death:  0302 AM  ?Length of Stay: 1  ? ?Principle Cause of death: ICH  ? ?Hospital Diagnoses: ?Left-sided intraparenchymal hematoma of brain ?Subdural hematoma ? ?Hospital Course: ?Brief Summary:- ?88 y.o. male with medical history significant for hyperlipidemia, hypertension, atrial fibrillation on Xarelto and glaucoma admitted on 06/12/2021 with intracranial hemorrhage ?  ?A/p ?Left-sided intraparenchymal hematoma of brain ?Subdural hematoma ?Neurosurgeon (Dr. Arnoldo Morale) was consulted and recommended comfort care based on size of bleed and minimal chances of surgical survival ?-- Patient was treated with iV morphine drip/palliative/comfort care protocols ?--Patient was evaluated by hospice on 2021-07-12 ?-Patient has accepted to hospice service and transitioned  to hospice/GIP status pending bed availability at residential hospice ?-Patient expired on 07-14-2021 at 030 2 AM with family at bedside ? ?Procedures: Na ? ?Consultations: Neurosurgery/hospice ? ?The results of significant diagnostics from this hospitalization (including imaging, microbiology, ancillary and laboratory) are listed below for reference.  ? ?Significant Diagnostic Studies: ?CT Head Wo Contrast ? ?Result Date: 06/11/2021 ?CLINICAL DATA:  Found unconscious EXAM: CT HEAD WITHOUT CONTRAST TECHNIQUE: Contiguous axial images were obtained from the base of the skull through the vertex without intravenous contrast. RADIATION DOSE REDUCTION: This exam was performed according to the departmental dose-optimization program which includes automated exposure control, adjustment of the mA and/or kV according to patient size and/or use of iterative reconstruction technique. COMPARISON:  None. FINDINGS: Brain: Large intraparenchymal hematoma in the left  frontal lobe measuring 5.9 x 4.5 x 3.8 cm. There is rightward midline shift 8 mm with subfalcine herniation of the cingulate gyrus. There is a left-sided subdural hematoma of mixed density measuring 7 mm thick. Left lateral ventricle is effaced. Basal cisterns remain patent. Vascular: Calcification of the carotid arteries at the skull base Skull: Unremarkable Sinuses/Orbits: Normal orbits.  Paranasal sinuses are clear. Other: None IMPRESSION: 1. Large intraparenchymal hematoma in the left frontal lobe measuring 5.9 x 4.5 x 3.8 cm, with 8 mm rightward midline shift and subfalcine herniation of the cingulate gyrus. 2. Left-sided subdural hematoma of mixed density measuring 7 mm thick. Critical Value/emergent results were called by telephone at the time of interpretation on 06/11/2021 at 9:01 pm to provider JOSEPH ZAMMIT , who verbally acknowledged these results. Electronically Signed   By: Ulyses Jarred M.D.   On: 06/11/2021 21:01  ? ?DG Chest Port 1 View ? ?Result Date: 06/11/2021 ?CLINICAL DATA:  Possible aspiration EXAM: PORTABLE CHEST 1 VIEW COMPARISON:  Film from 30 minutes previous FINDINGS: Cardiac shadow remains enlarged. Aortic calcifications are again seen. Lungs are well aerated bilaterally with patchy interstitial markings bilaterally stable from the prior exam. No new basilar changes to suggest aspiration are noted. No bony abnormality is seen. IMPRESSION: Stable appearance from 30 minutes previous. No findings to suggest aspiration are noted. Electronically Signed   By: Inez Catalina M.D.   On: 06/11/2021 21:22  ? ?DG Chest Port 1 View ? ?Result Date: 06/11/2021 ?CLINICAL DATA:  Possible sepsis, altered mental status, lethargy EXAM: PORTABLE CHEST 1 VIEW COMPARISON:  None. FINDINGS: Transverse diameter of heart is increased. Central pulmonary vessels are prominent. There is slight prominence of interstitial markings in the parahilar regions and lower lung fields. There is blunting of right lateral CP angle.  There is no pneumothorax. Degenerative changes are noted in the left shoulder.  IMPRESSION: Cardiomegaly. Increased interstitial markings in the parahilar regions and lower lung fields may suggest interstitial pulmonary edema or interstitial pneumonia. Small right pleural effusion. Electronically Signed   By: Elmer Picker M.D.   On: 06/11/2021 20:38   ? ?Microbiology: ?Recent Results (from the past 240 hour(s))  ?Blood Culture (routine x 2)     Status: None  ? Collection Time: 06/11/21  7:57 PM  ? Specimen: BLOOD RIGHT FOREARM  ?Result Value Ref Range Status  ? Specimen Description BLOOD RIGHT FOREARM  Final  ? Special Requests   Final  ?  BOTTLES DRAWN AEROBIC ONLY Blood Culture results may not be optimal due to an inadequate volume of blood received in culture bottles  ? Culture   Final  ?  NO GROWTH 5 DAYS ?Performed at [facility], [address] [place], [place] [address] ?  ? Report Status 04-Jul-2021 FINAL  Final  ?Blood Culture (routine x 2)     Status: None  ? Collection Time: 06/11/21  8:03 PM  ? Specimen: Right Antecubital; Blood  ?Result Value Ref Range Status  ? Specimen Description RIGHT ANTECUBITAL  Final  ? Special Requests   Final  ?  BOTTLES DRAWN AEROBIC AND ANAEROBIC Blood Culture results may not be optimal due to an excessive volume of blood received in culture bottles  ? Culture   Final  ?  NO GROWTH 5 DAYS ?Performed at [facility], [address] [place], [place] [address] ?  ? Report Status July 04, 2021 FINAL  Final  ? ?Signed: ?Roxan Hockey, MD ?2021-07-04 ? ? ?

## 2021-07-05 NOTE — Discharge Summary (Signed)
Brief Summary:- ?88 y.o. male with medical history significant for hyperlipidemia, hypertension, atrial fibrillation on Xarelto and glaucoma admitted on 06/12/2021 with intracranial hemorrhage ?  ?A/p ?Left-sided intraparenchymal hematoma of brain ?Subdural hematoma ?Neurosurgeon (Dr. Arnoldo Morale) was consulted and recommended comfort care based on size of bleed and minimal chances of surgical survival ?-- Patient was treated with iV morphine drip/palliative/comfort care protocols ?--Patient was evaluated by hospice on 06/26/2021 ?-Patient has accepted to hospice service and transitioned  to hospice/GIP status pending bed availability at residential hospice ?-Patient expired on 2021-07-12 at 030 2 AM with family at bedside ? ?-- Please see death summary ?

## 2021-07-05 DEATH — deceased

## 2023-01-02 IMAGING — CT CT HEAD W/O CM
5 of 8 series · 17 of 47 positions shown, 18 images · non-contrast
Comparison: None.

CLINICAL DATA: Found unconscious



[Series 2: head w o · axial · 0.44mm/px · z∈[-70,-20]mm · 2 of 32 slices shown, 3 images (1 of 2)]
[im 11/32  brain]
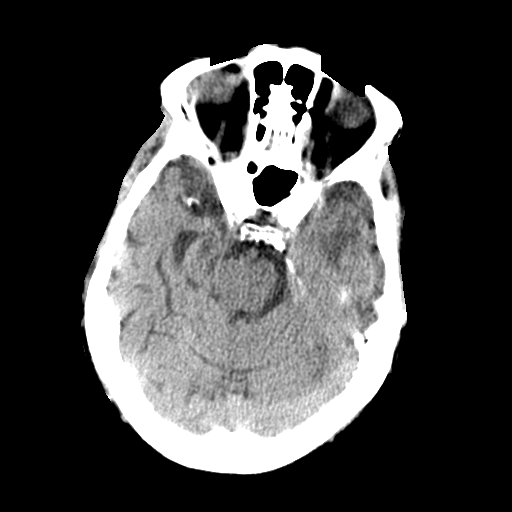
[im 11/32  bone]
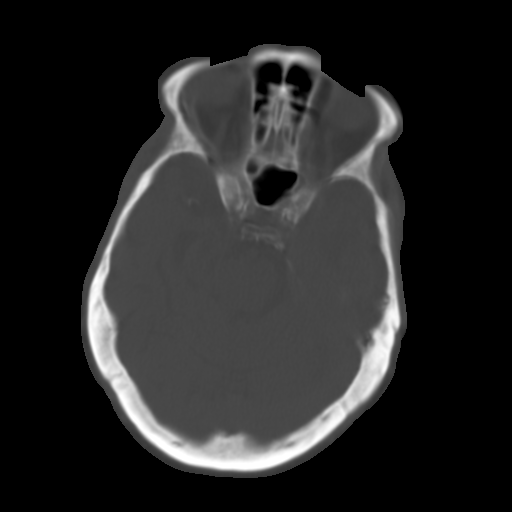
[im 21/32  brain]
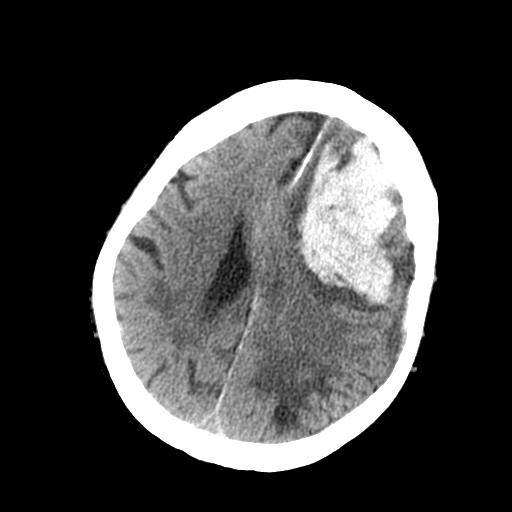

[Series 3: head bone · axial · 0.44mm/px · z∈[-106,+4]mm · 7 of 79 slices shown]
[im 8/79  bone]
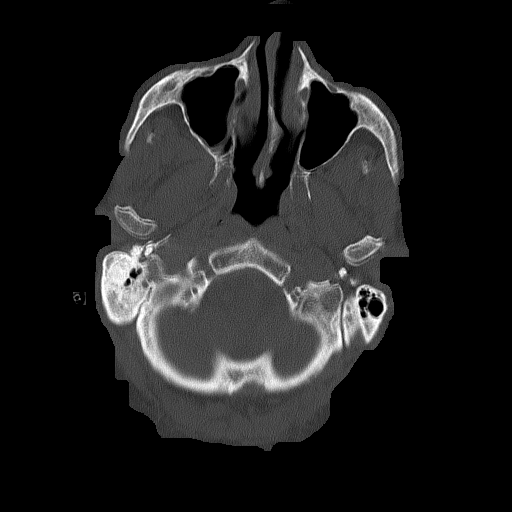
[im 16/79  bone]
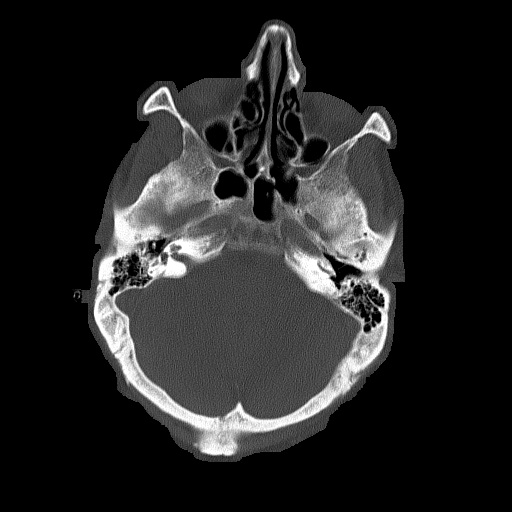
[im 24/79  bone]
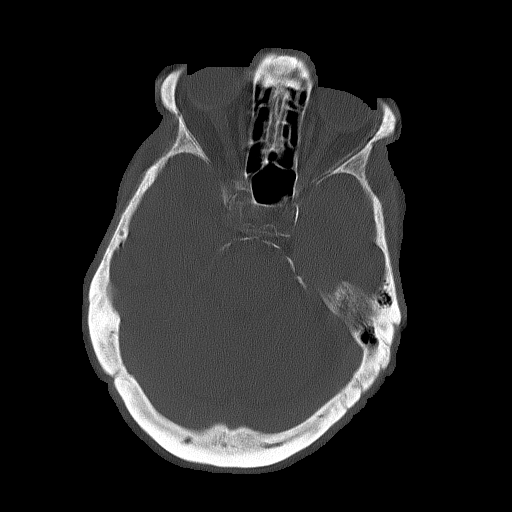
[im 32/79  bone]
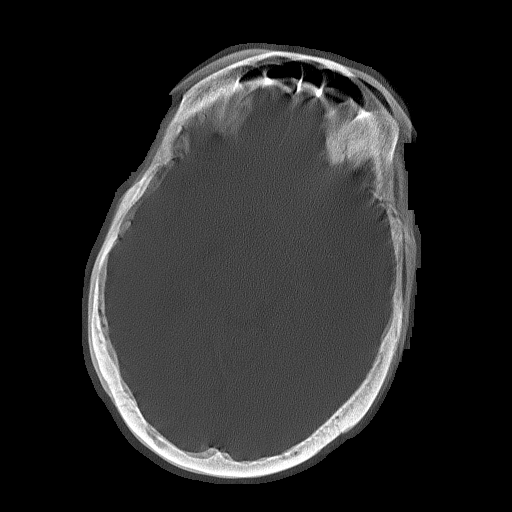
[im 47/79  bone]
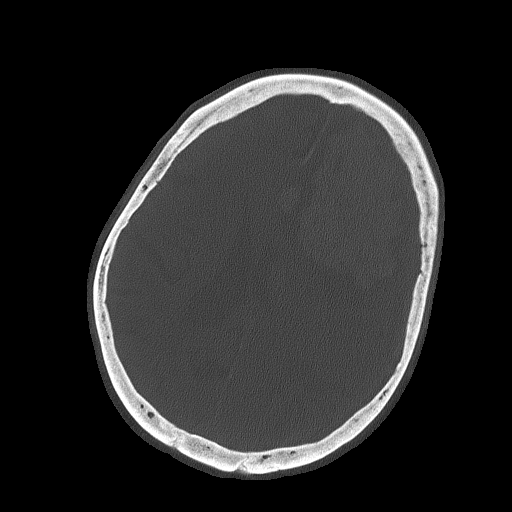
[im 55/79  bone]
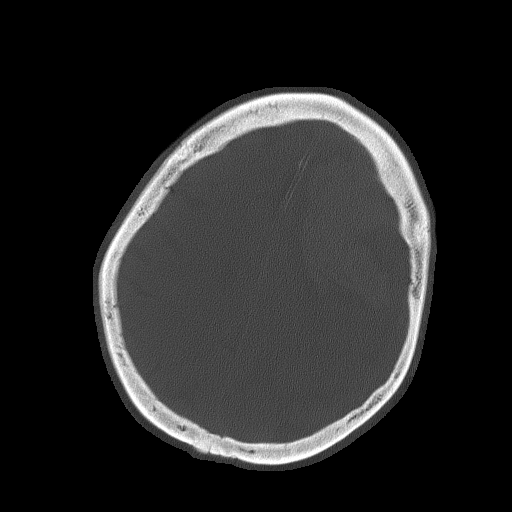
[im 63/79  bone]
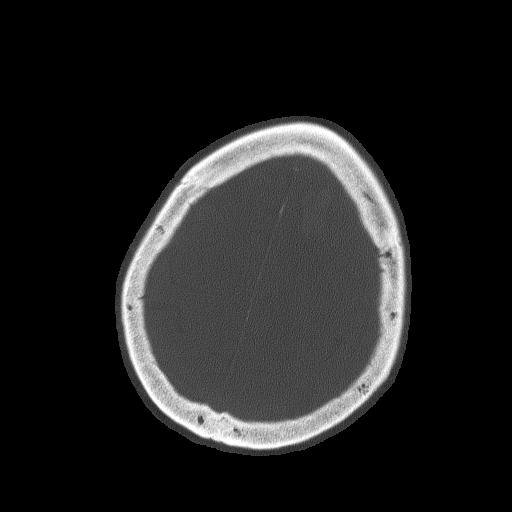

[Series 4: coronal soft · coronal · 0.38mm/px · 3 of 84 slices shown]
[im 21/84  brain]
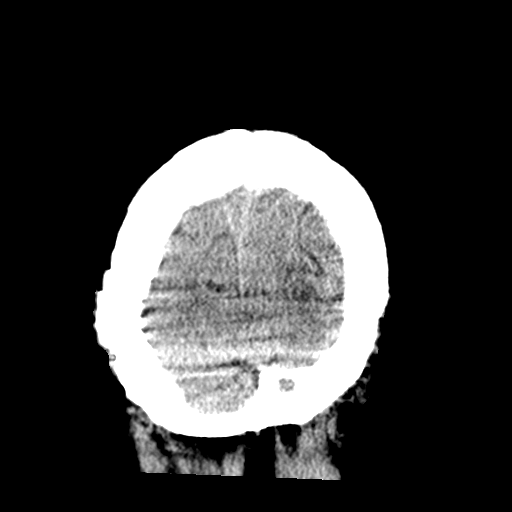
[im 42/84  brain]
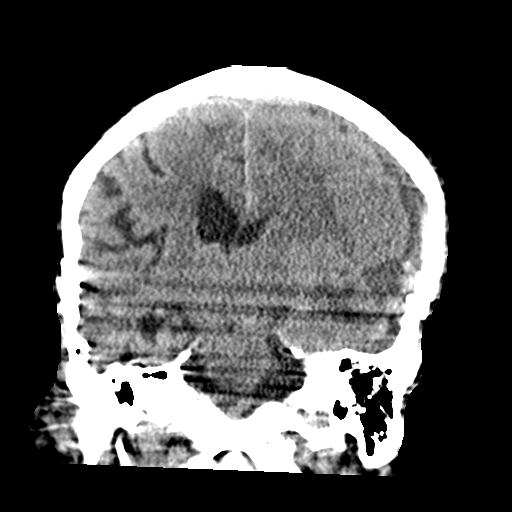
[im 63/84  brain]
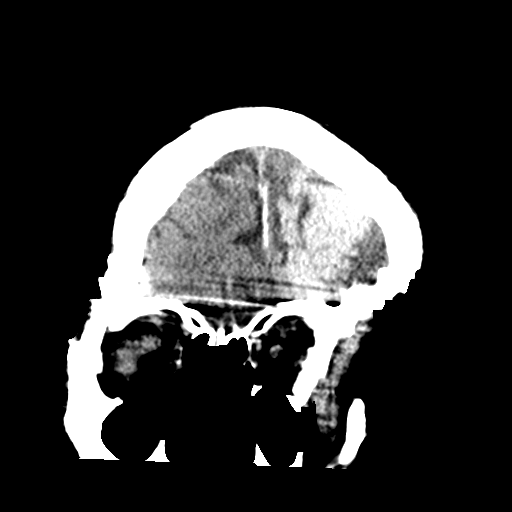

[Series 5: sagittal soft · sagittal · 0.36mm/px · 3 of 82 slices shown]
[im 7/82  brain]
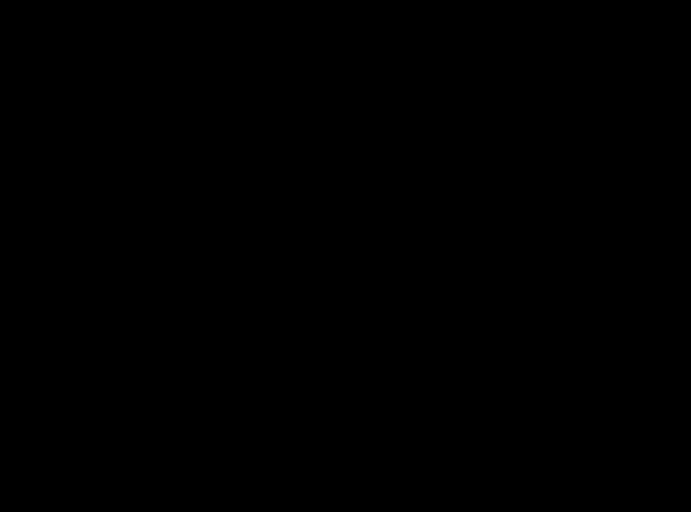
[im 32/82  brain]
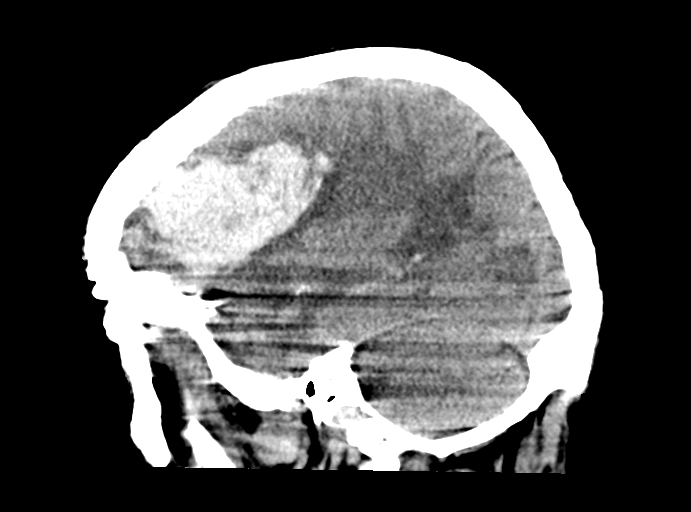
[im 57/82  brain]
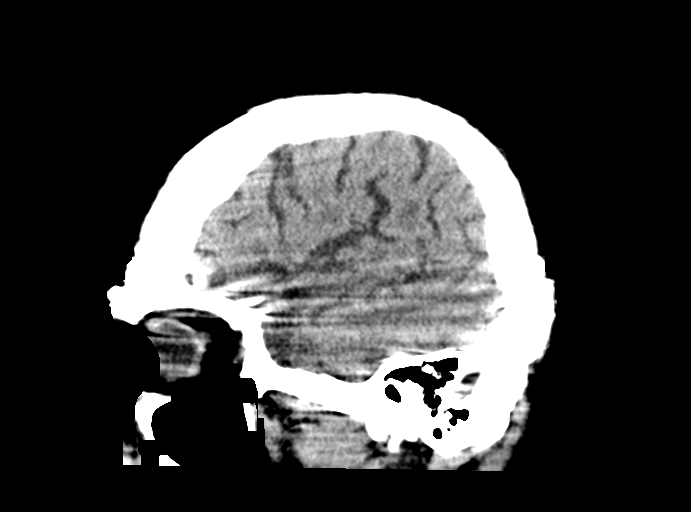

[Series 6: head w o · axial · 0.48mm/px · z∈[-70,-20]mm · 2 of 32 slices shown (2 of 2)]
[im 11/32  brain]
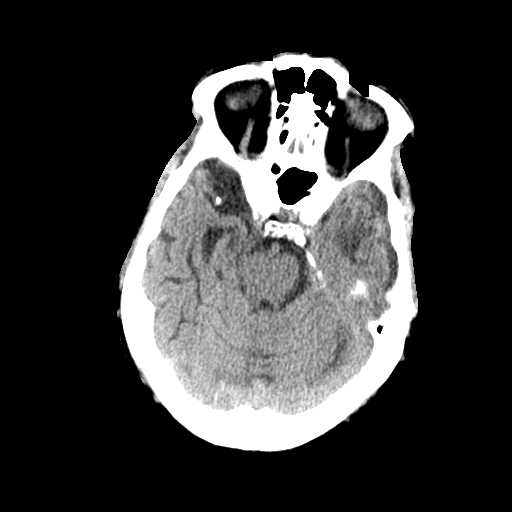
[im 21/32  brain]
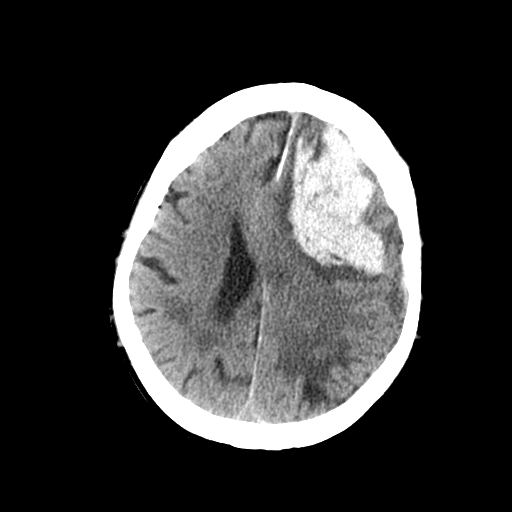

[17 of 47 positions shown; findings below may reference images not displayed]

FINDINGS: Brain: Large intraparenchymal hematoma in the left frontal lobe
measuring 5.9 x 4.5 x 3.8 cm. There is rightward midline shift 8 mm
with subfalcine herniation of the cingulate gyrus. There is a
left-sided subdural hematoma of mixed density measuring 7 mm thick.
Left lateral ventricle is effaced. Basal cisterns remain patent.

Vascular: Calcification of the carotid arteries at the skull base

Skull: Unremarkable

Sinuses/Orbits: Normal orbits.  Paranasal sinuses are clear.

Other: None
IMPRESSION: 1. Large intraparenchymal hematoma in the left frontal lobe
measuring 5.9 x 4.5 x 3.8 cm, with 8 mm rightward midline shift and
subfalcine herniation of the cingulate gyrus.
2. Left-sided subdural hematoma of mixed density measuring 7 mm
thick.

Critical Value/emergent results were called by telephone at the time
of interpretation on 06/11/2021 at [DATE] to provider ALEXANDER OMAR MANZUR ,
who verbally acknowledged these results.
